# Patient Record
Sex: Female | Born: 1935 | Race: White | Hispanic: No | Smoking: Former smoker
Health system: Southern US, Community
[De-identification: ages and names within clinical notes are randomized; demographics above are authoritative.]

## PROBLEM LIST (undated history)

## (undated) DIAGNOSIS — M1990 Unspecified osteoarthritis, unspecified site: Secondary | ICD-10-CM

## (undated) DIAGNOSIS — I10 Essential (primary) hypertension: Secondary | ICD-10-CM

## (undated) DIAGNOSIS — H269 Unspecified cataract: Secondary | ICD-10-CM

## (undated) DIAGNOSIS — E079 Disorder of thyroid, unspecified: Secondary | ICD-10-CM

## (undated) DIAGNOSIS — M810 Age-related osteoporosis without current pathological fracture: Secondary | ICD-10-CM

## (undated) HISTORY — PX: EYE SURGERY: SHX253

## (undated) HISTORY — DX: Unspecified cataract: H26.9

## (undated) HISTORY — DX: Essential (primary) hypertension: I10

## (undated) HISTORY — DX: Unspecified osteoarthritis, unspecified site: M19.90

## (undated) HISTORY — DX: Disorder of thyroid, unspecified: E07.9

## (undated) HISTORY — DX: Age-related osteoporosis without current pathological fracture: M81.0

---

## 1999-01-24 ENCOUNTER — Other Ambulatory Visit: Admission: RE | Admit: 1999-01-24 | Discharge: 1999-01-24 | Payer: Self-pay | Admitting: *Deleted

## 2000-07-06 ENCOUNTER — Encounter: Admission: RE | Admit: 2000-07-06 | Discharge: 2000-07-06 | Payer: Self-pay | Admitting: Internal Medicine

## 2000-07-06 ENCOUNTER — Encounter: Payer: Self-pay | Admitting: Internal Medicine

## 2014-06-20 ENCOUNTER — Ambulatory Visit (INDEPENDENT_AMBULATORY_CARE_PROVIDER_SITE_OTHER): Payer: Medicare Other | Admitting: Family Medicine

## 2014-06-20 ENCOUNTER — Ambulatory Visit (INDEPENDENT_AMBULATORY_CARE_PROVIDER_SITE_OTHER): Payer: Medicare Other

## 2014-06-20 VITALS — BP 160/62 | HR 74 | Temp 98.3°F | Resp 16 | Ht 65.0 in | Wt 120.0 lb

## 2014-06-20 DIAGNOSIS — M25571 Pain in right ankle and joints of right foot: Secondary | ICD-10-CM

## 2014-06-20 DIAGNOSIS — Z23 Encounter for immunization: Secondary | ICD-10-CM

## 2014-06-20 DIAGNOSIS — S82892A Other fracture of left lower leg, initial encounter for closed fracture: Secondary | ICD-10-CM | POA: Diagnosis not present

## 2014-06-20 DIAGNOSIS — M79672 Pain in left foot: Secondary | ICD-10-CM | POA: Diagnosis not present

## 2014-06-20 NOTE — Progress Notes (Signed)
Chief Complaint:  Chief Complaint  Patient presents with  . Fall    Noon today    HPI: Melissa Burton is a 81 y.o. female who is here for fell off steps today, at noon, she misstepped on the last step and landed on her side. She had no prodrome sxs.  She has osteoporosis. Pain with sitting and weightbearing, worse with weightbearing. "very uncomfortable". She has no n/w/t. She has not tried anything for this.  She lives alone, in small house. She has a scratch on the top of her foot. She ahs put ice on it but nothing else. She cannot walk on her foot at all, it is "moderately to severely painful" No prior injuries.   Past Medical History  Diagnosis Date  . Arthritis   . Cataract   . Hypertension   . Osteoporosis   . Thyroid disease    Past Surgical History  Procedure Laterality Date  . Eye surgery     History   Social History  . Marital Status: Divorced    Spouse Name: N/A  . Number of Children: N/A  . Years of Education: N/A   Social History Main Topics  . Smoking status: Former Games developer  . Smokeless tobacco: Not on file  . Alcohol Use: No  . Drug Use: No  . Sexual Activity: Not on file   Other Topics Concern  . None   Social History Narrative  . None   Family History  Problem Relation Age of Onset  . Diabetes Sister   . Hyperlipidemia Sister   . Diabetes Brother   . Cancer Sister    No Known Allergies Prior to Admission medications   Medication Sig Start Date End Date Taking? Authorizing Provider  amLODipine (NORVASC) 10 MG tablet Take 10 mg by mouth daily.   Yes Historical Provider, MD  levothyroxine (SYNTHROID, LEVOTHROID) 50 MCG tablet Take 50 mcg by mouth daily before breakfast.   Yes Historical Provider, MD  lisinopril (PRINIVIL,ZESTRIL) 20 MG tablet Take 20 mg by mouth 2 (two) times daily.   Yes Historical Provider, MD     ROS: The patient denies fevers, chills, night sweats, unintentional weight loss, chest pain, palpitations, wheezing,  dyspnea on exertion, nausea, vomiting, abdominal pain, dysuria, hematuria, melena, numbness, weakness, or tingling.   All other systems have been reviewed and were otherwise negative with the exception of those mentioned in the HPI and as above.    PHYSICAL EXAM: Filed Vitals:   06/20/14 1404  BP: 160/62  Pulse: 74  Temp: 98.3 F (36.8 C)  Resp: 16   Filed Vitals:   06/20/14 1404  Height:  (1.651 m)  Weight: 120 lb (54.432 kg)   Body mass index is 19.97 kg/(m^2).   General: Alert, no acute distress HEENT:  Normocephalic, atraumatic, oropharynx patent. EOMI, PERRLA Cardiovascular:  Regular rate and rhythm, no rubs murmurs or gallops.  No Carotid bruits, radial pulse intact.  Respiratory: Clear to auscultation bilaterally.  No wheezes, rales, or rhonchi.  No cyanosis, no use of accessory musculature GI: No organomegaly, abdomen is soft and non-tender, positive bowel sounds.  No masses. Skin: No rashes. Neurologic: Facial musculature symmetric. Psychiatric: Patient is appropriate throughout our interaction. Lymphatic: No cervical lymphadenopathy Musculoskeletal: Gait antalgic Let foot -+ abrasion on top of foot, tender with dorsi and plantar flexion, 5/5 strength, sesnation intact DP intact  Right foot + swelling, no ecchymosis, + s   LABS: No results found for this  or any previous visit.   EKG/XRAY:   Primary read interpreted by Dr. Conley Rolls at [facility] for fracture or dislocation of right foot ? Left talus fracture on lateral view Please comment if there is a break in the cortex of the left navicular as well   ASSESSMENT/PLAN: Encounter Diagnoses  Name Primary?  . Pain in joint, ankle and foot, right Yes  . Left foot pain   . Avulsion fracture of ankle, left, closed, initial encounter   . Need for prophylactic vaccination with tetanus-diphtheria (TD)    81 y/o with PMH arthritis, osteoporosis who presents with left talus avulsion fracture  Refer to ortho She was  given crutches, sweedo for right foot sprain and left short cam boot  for right avulsion fracture She really needs a walker but she cannot maneuver in her house with a walker per the patient Take tylenol prn for pain She was given tetanus booster due to abrasion on foot. Not utd on tetanus until today  Gross sideeffects, risk and benefits, and alternatives of medications d/w patient. Patient is aware that all medications have potential sideeffects and we are unable to predict every sideeffect or drug-drug interaction that may occur.  Hamilton Capri PHUONG, DO 06/25/2014 7:17 PM

## 2016-03-02 IMAGING — CR DG ANKLE COMPLETE 3+V*L*
4 series · 4 of 4 positions shown · non-contrast
Comparison: Left foot radiographs obtained at the same time.

CLINICAL DATA: Left ankle pain following a fall today.

EXAM:
LEFT ANKLE COMPLETE - 3+ VIEW

[AP]
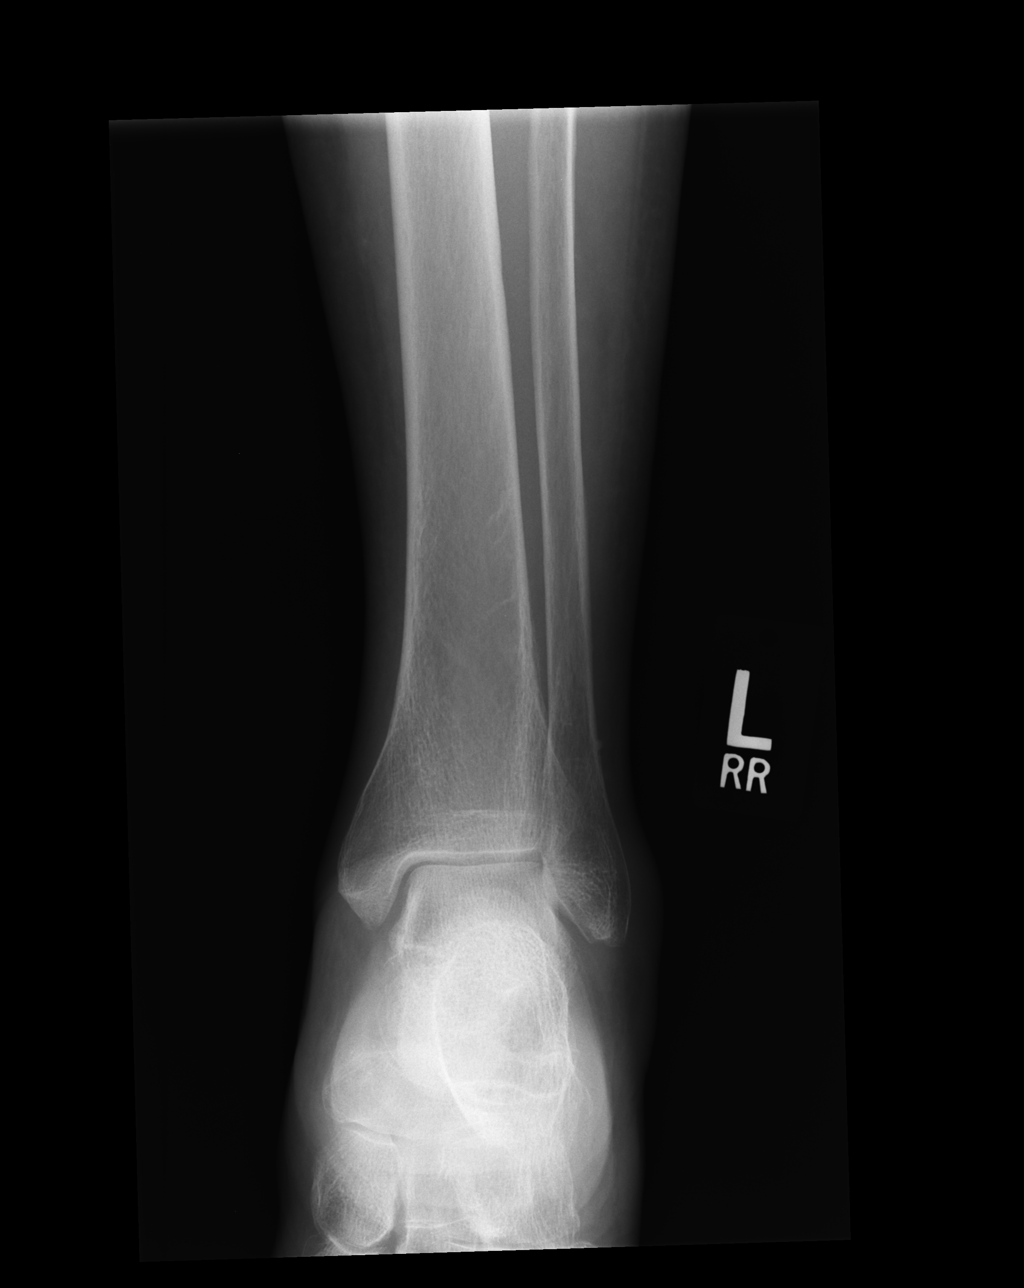

[ap obl int rot (1 of 2)]
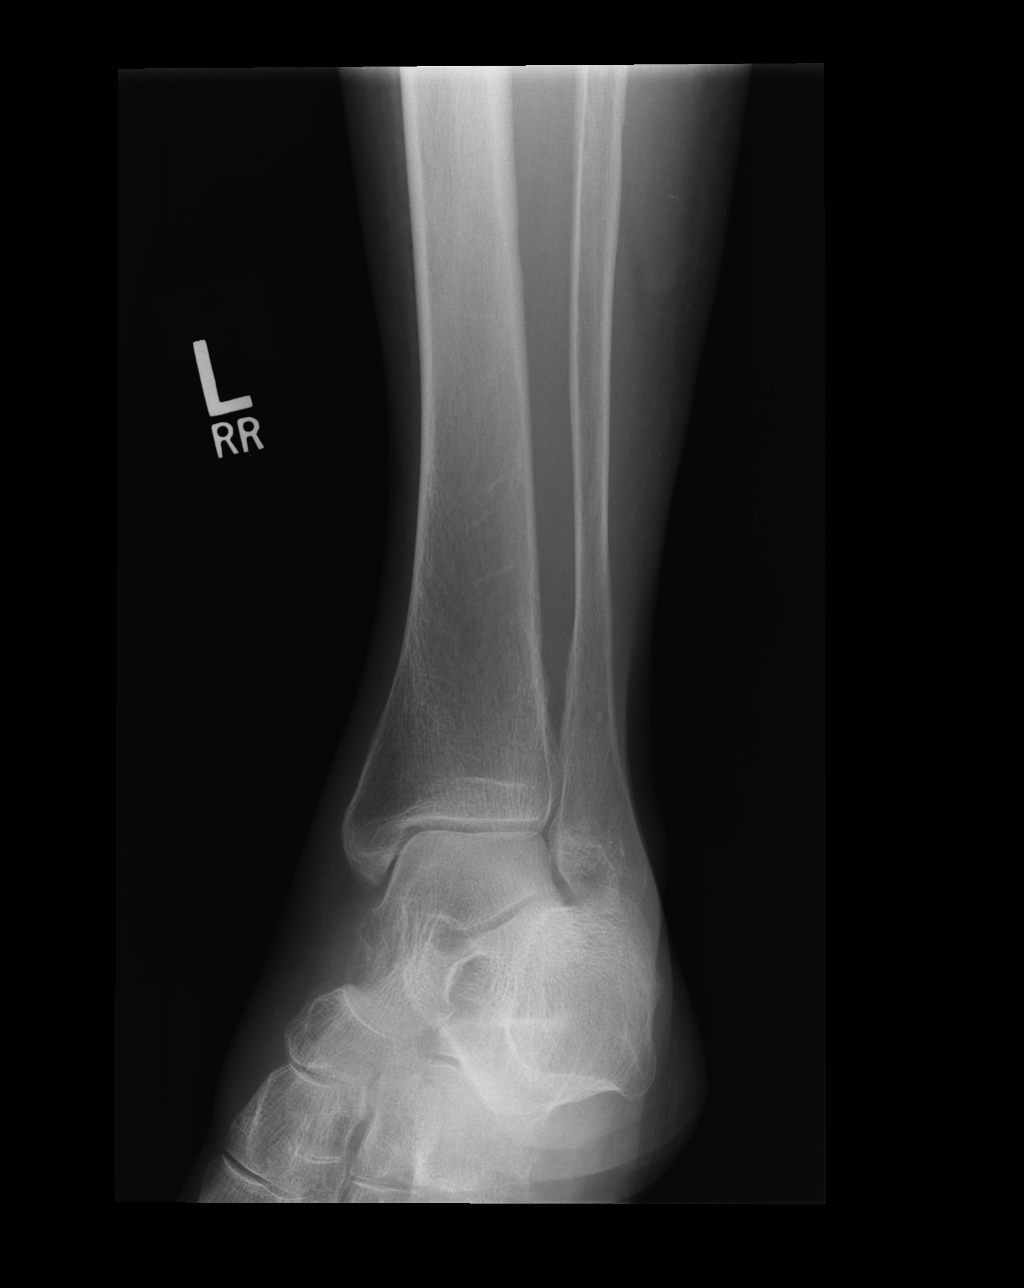

[ap obl int rot (2 of 2)]
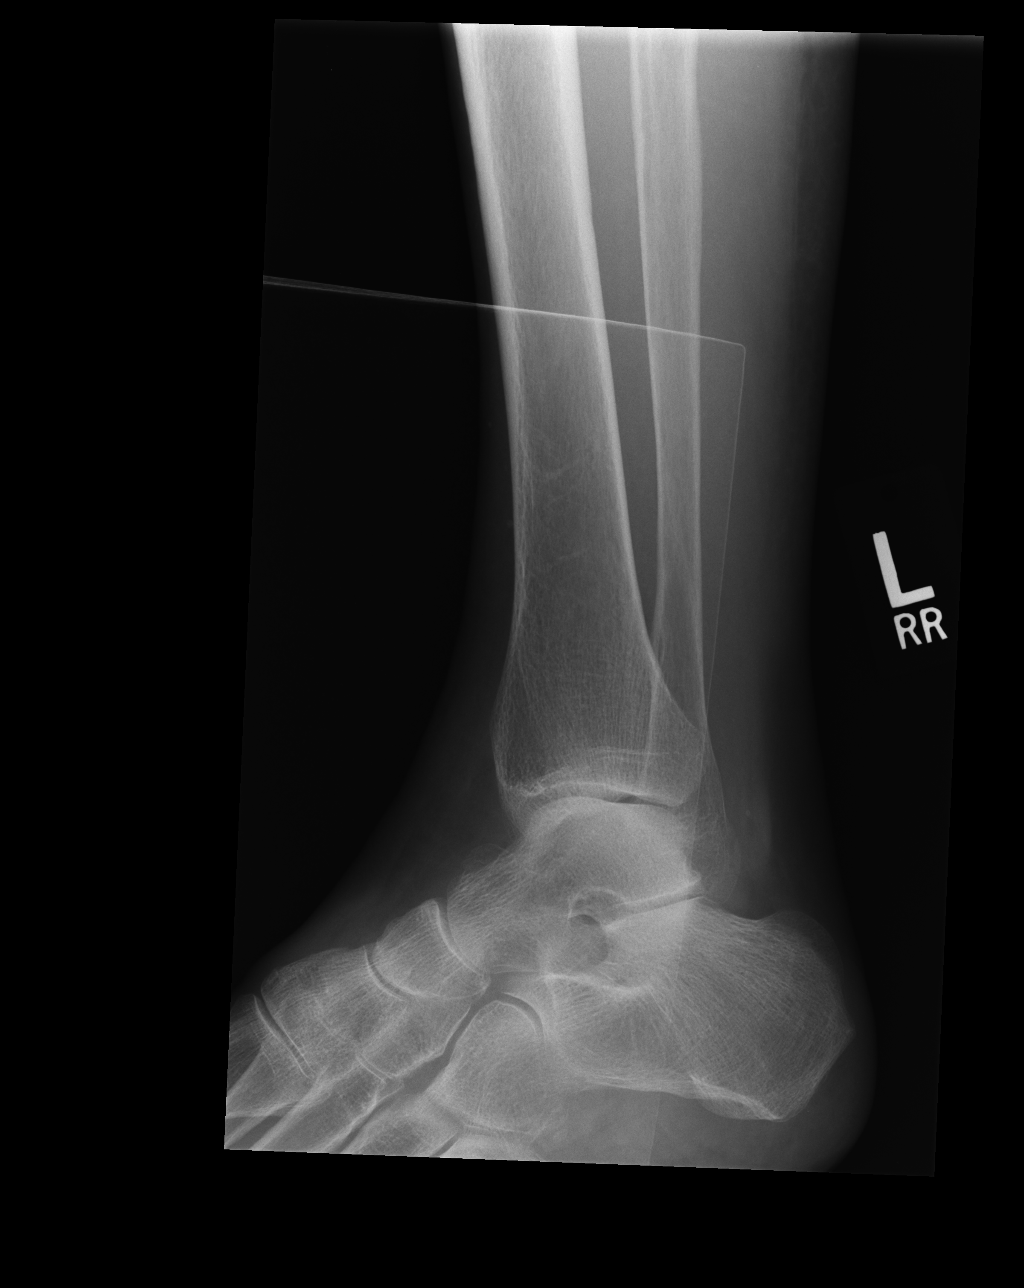

[lateral]
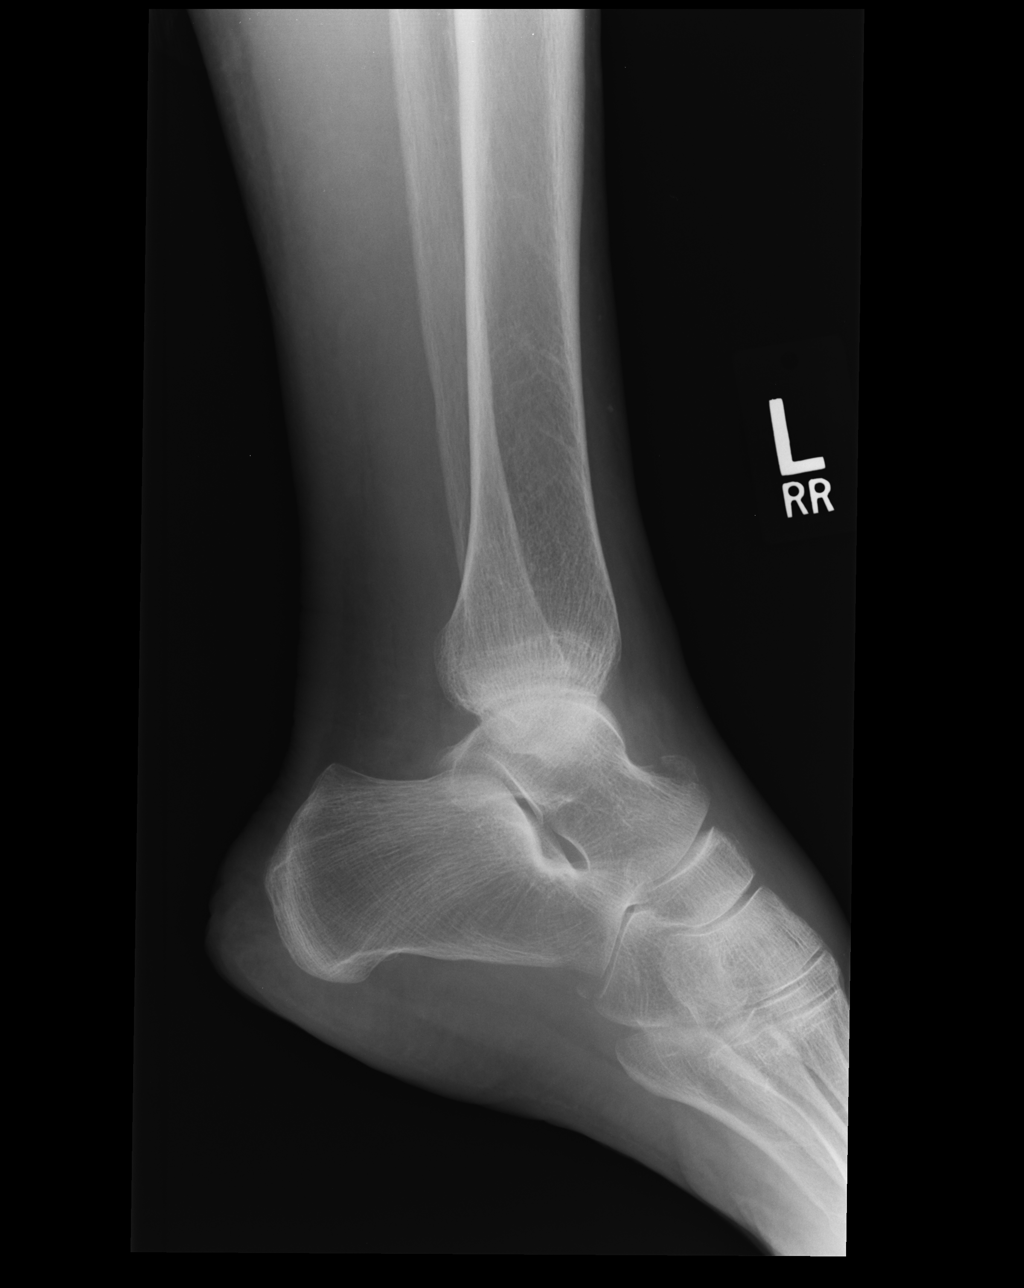

[4 of 4 positions shown; findings below may reference images not displayed]

FINDINGS: Diffuse soft tissue swelling, most pronounced laterally. Distal
dorsal talar avulsion fracture. No other fractures or dislocation.
No effusion.
IMPRESSION: Dorsal talar avulsion fracture.

## 2020-07-13 ENCOUNTER — Emergency Department (HOSPITAL_COMMUNITY)
Admission: EM | Admit: 2020-07-13 | Discharge: 2020-07-14 | Disposition: A | Discharge status: Home / Self Care | Payer: Medicare Other | Attending: Emergency Medicine | Admitting: Emergency Medicine

## 2020-07-13 ENCOUNTER — Other Ambulatory Visit: Payer: Self-pay

## 2020-07-13 ENCOUNTER — Encounter (HOSPITAL_COMMUNITY): Payer: Self-pay | Admitting: Emergency Medicine

## 2020-07-13 ENCOUNTER — Emergency Department (HOSPITAL_COMMUNITY): Payer: Medicare Other

## 2020-07-13 DIAGNOSIS — I10 Essential (primary) hypertension: Secondary | ICD-10-CM | POA: Diagnosis not present

## 2020-07-13 DIAGNOSIS — R600 Localized edema: Secondary | ICD-10-CM | POA: Insufficient documentation

## 2020-07-13 DIAGNOSIS — E871 Hypo-osmolality and hyponatremia: Secondary | ICD-10-CM | POA: Insufficient documentation

## 2020-07-13 DIAGNOSIS — R609 Edema, unspecified: Secondary | ICD-10-CM

## 2020-07-13 DIAGNOSIS — Z87891 Personal history of nicotine dependence: Secondary | ICD-10-CM | POA: Insufficient documentation

## 2020-07-13 DIAGNOSIS — Z79899 Other long term (current) drug therapy: Secondary | ICD-10-CM | POA: Diagnosis not present

## 2020-07-13 DIAGNOSIS — R2243 Localized swelling, mass and lump, lower limb, bilateral: Secondary | ICD-10-CM | POA: Diagnosis present

## 2020-07-13 LAB — COMPREHENSIVE METABOLIC PANEL
ALT: 19 U/L (ref 0–44)
AST: 23 U/L (ref 15–41)
Albumin: 3.9 g/dL (ref 3.5–5.0)
Alkaline Phosphatase: 106 U/L (ref 38–126)
Anion gap: 12 (ref 5–15)
BUN: 17 mg/dL (ref 8–23)
CO2: 22 mmol/L (ref 22–32)
Calcium: 9.2 mg/dL (ref 8.9–10.3)
Chloride: 93 mmol/L — ABNORMAL LOW (ref 98–111)
Creatinine, Ser: 0.7 mg/dL (ref 0.44–1.00)
GFR, Estimated: >60 mL/min (ref >60)
Glucose, Bld: 107 mg/dL — ABNORMAL HIGH (ref 70–99)
Potassium: 4.3 mmol/L (ref 3.5–5.1)
Sodium: 127 mmol/L — ABNORMAL LOW (ref 135–145)
Total Bilirubin: 0.7 mg/dL (ref 0.3–1.2)
Total Protein: 7.1 g/dL (ref 6.5–8.1)

## 2020-07-13 LAB — CBC WITH DIFFERENTIAL/PLATELET
Abs Immature Granulocytes: 0.02 10*3/uL (ref 0.00–0.07)
Basophils Absolute: 0.1 10*3/uL (ref 0.0–0.1)
Basophils Relative: 1 %
Eosinophils Absolute: 0.2 10*3/uL (ref 0.0–0.5)
Eosinophils Relative: 3 %
HCT: 34.8 % — ABNORMAL LOW (ref 36.0–46.0)
Hemoglobin: 12 g/dL (ref 12.0–15.0)
Immature Granulocytes: 0 %
Lymphocytes Relative: 18 %
Lymphs Abs: 1.4 10*3/uL (ref 0.7–4.0)
MCH: 31.7 pg (ref 26.0–34.0)
MCHC: 34.5 g/dL (ref 30.0–36.0)
MCV: 92.1 fL (ref 80.0–100.0)
Monocytes Absolute: 1.2 10*3/uL — ABNORMAL HIGH (ref 0.1–1.0)
Monocytes Relative: 15 %
Neutro Abs: 4.8 10*3/uL (ref 1.7–7.7)
Neutrophils Relative %: 63 %
Platelets: 367 10*3/uL (ref 150–400)
RBC: 3.78 MIL/uL — ABNORMAL LOW (ref 3.87–5.11)
RDW: 12.7 % (ref 11.5–15.5)
WBC: 7.6 10*3/uL (ref 4.0–10.5)
nRBC: 0 % (ref 0.0–0.2)

## 2020-07-13 LAB — BRAIN NATRIURETIC PEPTIDE: B Natriuretic Peptide: 95.4 pg/mL (ref 0.0–100.0)

## 2020-07-13 NOTE — ED Triage Notes (Signed)
Patient arrives after her doctor told her her sodium was low. Patient states that over the last few weeks, she has had some foot and ankle swelling also.

## 2020-07-13 NOTE — ED Provider Notes (Signed)
Emergency Medicine Provider Triage Evaluation Note  Melissa Burton , a 87 y.o. female  was evaluated in triage.  Pt complains of leg swelling.  She states her symptoms started about 3 days ago.  Reports a history of ankle swelling in the past but never of this severity.  Denies any shortness of breath or chest pain.  She followed up with her PCP yesterday who was concerned about her new onset leg swelling as well as possible developing cellulitis in the legs and started her on Lasix, Klor-Con, as well as doxycycline.  She states that she has been taking these but her symptoms have persisted.  She was contacted today and told that she was also hyponatremic and was told to come to the emergency department this evening for further evaluation.  Physical Exam  BP (!) 146/63 (BP Location: Right Arm)   Pulse 84   Temp 98.5 F (36.9 C) (Oral)   Resp 15   Ht 5\' 5"  (1.651 m)   Wt 54.9 kg   SpO2 94%   BMI 20.14 kg/m  Gen:   Awake, no distress   Resp:  Normal effort  MSK:   Moves extremities without difficulty  Other:  2+ pitting edema noted in the bilateral lower extremities up to the mid calf.  Circumferential erythema with blistering consistent with developing cellulitis.  Medical Decision Making  Medically screening exam initiated at 8:25 PM.  Appropriate orders placed.  Melissa Burton was informed that the remainder of the evaluation will be completed by another provider, this initial triage assessment does not replace that evaluation, and the importance of remaining in the ED until their evaluation is complete.   Trudee Grip, PA-C 07/13/20 2028    2029, MD 07/14/20 0040

## 2020-07-14 LAB — URINALYSIS, ROUTINE W REFLEX MICROSCOPIC
Bilirubin Urine: NEGATIVE
Glucose, UA: NEGATIVE mg/dL
Ketones, ur: 20 mg/dL — AB
Leukocytes,Ua: NEGATIVE
Nitrite: NEGATIVE
Protein, ur: NEGATIVE mg/dL
Specific Gravity, Urine: 1.01 (ref 1.005–1.030)
pH: 7 (ref 5.0–8.0)

## 2020-07-14 NOTE — ED Provider Notes (Signed)
[facility]-EMERGENCY DEPT Provider Note   CSN: [id number] Arrival date & time: 07/13/20  1928     History Chief Complaint  Patient presents with  . Leg Swelling    Melissa Burton is a 87 y.o. female.  The history is provided by the patient and a relative.  Patient presents with bilateral lower extremity edema.  Patient has had this previously, but has been getting worse for the past several days.  She has been getting monitored closely by her PCP is been placed on Lasix.  No chest pain or shortness of breath is reported.  There is also concern about cellulitis and patient was started on antibiotics. Patient was called today by PCP and told to go the ER due to low sodium. Patient is otherwise at her baseline.  She was recently diagnosed with compression fracture in her lumbar spine and has follow-up with spine specialist later in the month.  No acute issues from this tonight.  Patient is able to ambulate when she is standing.  Patient presents with her granddaughter who provides most of the history due to patient is hard of hearing    Past Medical History:  Diagnosis Date  . Arthritis   . Cataract   . Hypertension   . Osteoporosis   . Thyroid disease     There are no problems to display for this patient.   Past Surgical History:  Procedure Laterality Date  . EYE SURGERY       OB History   No obstetric history on file.     Family History  Problem Relation Age of Onset  . Diabetes Sister   . Hyperlipidemia Sister   . Diabetes Brother   . Cancer Sister     Social History   Tobacco Use  . Smoking status: Former    Pack years: 0.00  Substance Use Topics  . Alcohol use: No    Alcohol/week: 0.0 standard drinks  . Drug use: No    Home Medications Prior to Admission medications   Medication Sig Start Date End Date Taking? Authorizing Provider  amLODipine (NORVASC) 10 MG tablet Take 10 mg by mouth daily.    [provider]   levothyroxine (SYNTHROID, LEVOTHROID) 50 MCG tablet Take 50 mcg by mouth daily before breakfast.    [provider]  lisinopril (PRINIVIL,ZESTRIL) 20 MG tablet Take 20 mg by mouth 2 (two) times daily.    [provider]    Allergies    Patient has no known allergies.  Review of Systems   Review of Systems  Constitutional:  Negative for fever.  Respiratory:  Negative for shortness of breath.   Cardiovascular:  Positive for leg swelling. Negative for chest pain.  Musculoskeletal:  Positive for back pain.  All other systems reviewed and are negative.  Physical Exam Updated Vital Signs BP (!) 160/65   Pulse (!) 101   Temp 98.5 F (36.9 C) (Oral)   Resp 20   Ht 1.651 m (5\' 5" )   Wt 54.9 kg   SpO2 94%   BMI 20.14 kg/m   Physical Exam CONSTITUTIONAL: Elderly, no acute distress HEAD: Normocephalic/atraumatic EYES: EOMI/PERRL ENMT: Mucous membranes moist NECK: supple no meningeal signs SPINE/BACK:entire spine nontender CV: S1/S2 noted, no murmurs/rubs/gallops noted LUNGS: Lungs are clear to auscultation bilaterally, no apparent distress ABDOMEN: soft, nontender GU:no cva tenderness NEURO: Pt is awake/alert/appropriate, moves all extremitiesx4.  No facial droop.   EXTREMITIES: pulses normal/equal, full ROM, symmetric pitting edema to bilateral  lower extremities.  Overlying erythema is noted in both legs.  No crepitus is noted. SKIN: warm, color normal PSYCH: no abnormalities of mood noted, alert and oriented to situation  ED Results / Procedures / Treatments   Labs (all labs ordered are listed, but only abnormal results are displayed) Labs Reviewed  CBC WITH DIFFERENTIAL/PLATELET - Abnormal; Notable for the following components:      Result Value   RBC 3.78 (*)    HCT 34.8 (*)    Monocytes Absolute 1.2 (*)    All other components within normal limits  COMPREHENSIVE METABOLIC PANEL - Abnormal; Notable for the following components:   Sodium 127 (*)     Chloride 93 (*)    Glucose, Bld 107 (*)    All other components within normal limits  URINALYSIS, ROUTINE W REFLEX MICROSCOPIC - Abnormal; Notable for the following components:   Color, Urine STRAW (*)    Hgb urine dipstick SMALL (*)    Ketones, ur 20 (*)    Bacteria, UA RARE (*)    Crystals PRESENT (*)    All other components within normal limits  BRAIN NATRIURETIC PEPTIDE    EKG EKG Interpretation  Date/Time:  Tuesday July 13 2020 20:54:48 EDT Ventricular Rate:  80 PR Interval:  206 QRS Duration: 78 QT Interval:  366 QTC Calculation: 422 R Axis:   31 Text Interpretation: Normal sinus rhythm Possible Anterior infarct , age undetermined Abnormal ECG Confirmed by Zadie Rhine ([id number]) on 07/13/2020 11:08:49 PM  Radiology No results found.  Procedures Procedures   Medications Ordered in ED Medications - No data to display  ED Course  I have reviewed the triage vital signs and the nursing notes.  Pertinent labs  results that were available during my care of the patient were reviewed by me and considered in my medical decision making (see chart for details).    MDM Rules/Calculators/A&P                          Patient was told to go to the ER due to hyponatremia and concern since patient is on furosemide.  When comparing previous labs, her sodium is actually improving.  No signs of CHF.  She already had a negative x-ray by her PCP.  She already had negative DVT studies.  Patient is on appropriate therapy as an outpatient, is already on antibiotics for possible cellulitis. She is  in no acute distress.  Patient can ambulate when she is standing, and she already has follow-up for her compression fracture She reports her pain is well controlled with Tylenol I have advised her to hold the Lasix for the next several days until she gets repeat electrolytes. Of note, her DVT studies did reveal a Baker's cyst which could be contribute to the edema and patient sits to most of the  day so some of this could be dependent edema Final Clinical Impression(s) / ED Diagnoses Final diagnoses:  Peripheral edema  Hyponatremia    Rx / DC Orders ED Discharge Orders     None        Zadie Rhine, MD 07/14/20 [phone]

## 2023-01-11 ENCOUNTER — Inpatient Hospital Stay (HOSPITAL_COMMUNITY): Payer: Medicare Other

## 2023-01-11 ENCOUNTER — Emergency Department (HOSPITAL_COMMUNITY): Payer: Medicare Other | Admitting: Anesthesiology

## 2023-01-11 ENCOUNTER — Inpatient Hospital Stay (HOSPITAL_COMMUNITY)
Admission: EM | Admit: 2023-01-11 | Discharge: 2023-02-03 | DRG: 023 | Disposition: E | Payer: Medicare Other | Attending: Student in an Organized Health Care Education/Training Program | Admitting: Student in an Organized Health Care Education/Training Program

## 2023-01-11 ENCOUNTER — Encounter (HOSPITAL_COMMUNITY): Admission: EM | Disposition: E | Payer: Self-pay | Source: Home / Self Care | Attending: Neurology

## 2023-01-11 ENCOUNTER — Emergency Department (HOSPITAL_COMMUNITY): Payer: Medicare Other

## 2023-01-11 DIAGNOSIS — H538 Other visual disturbances: Secondary | ICD-10-CM | POA: Diagnosis present

## 2023-01-11 DIAGNOSIS — R1310 Dysphagia, unspecified: Secondary | ICD-10-CM | POA: Diagnosis present

## 2023-01-11 DIAGNOSIS — E43 Unspecified severe protein-calorie malnutrition: Secondary | ICD-10-CM | POA: Diagnosis present

## 2023-01-11 DIAGNOSIS — R29727 NIHSS score 27: Secondary | ICD-10-CM | POA: Diagnosis present

## 2023-01-11 DIAGNOSIS — E039 Hypothyroidism, unspecified: Secondary | ICD-10-CM | POA: Diagnosis present

## 2023-01-11 DIAGNOSIS — Z7189 Other specified counseling: Secondary | ICD-10-CM

## 2023-01-11 DIAGNOSIS — L89150 Pressure ulcer of sacral region, unstageable: Secondary | ICD-10-CM | POA: Diagnosis present

## 2023-01-11 DIAGNOSIS — S0003XA Contusion of scalp, initial encounter: Secondary | ICD-10-CM | POA: Diagnosis present

## 2023-01-11 DIAGNOSIS — H9190 Unspecified hearing loss, unspecified ear: Secondary | ICD-10-CM | POA: Diagnosis present

## 2023-01-11 DIAGNOSIS — Z1152 Encounter for screening for COVID-19: Secondary | ICD-10-CM

## 2023-01-11 DIAGNOSIS — Z79890 Hormone replacement therapy (postmenopausal): Secondary | ICD-10-CM

## 2023-01-11 DIAGNOSIS — Z515 Encounter for palliative care: Secondary | ICD-10-CM | POA: Diagnosis not present

## 2023-01-11 DIAGNOSIS — I10 Essential (primary) hypertension: Secondary | ICD-10-CM

## 2023-01-11 DIAGNOSIS — G936 Cerebral edema: Secondary | ICD-10-CM | POA: Diagnosis present

## 2023-01-11 DIAGNOSIS — I63511 Cerebral infarction due to unspecified occlusion or stenosis of right middle cerebral artery: Principal | ICD-10-CM | POA: Diagnosis present

## 2023-01-11 DIAGNOSIS — I110 Hypertensive heart disease with heart failure: Secondary | ICD-10-CM | POA: Diagnosis present

## 2023-01-11 DIAGNOSIS — I619 Nontraumatic intracerebral hemorrhage, unspecified: Secondary | ICD-10-CM | POA: Diagnosis present

## 2023-01-11 DIAGNOSIS — I5032 Chronic diastolic (congestive) heart failure: Secondary | ICD-10-CM | POA: Diagnosis present

## 2023-01-11 DIAGNOSIS — Z833 Family history of diabetes mellitus: Secondary | ICD-10-CM

## 2023-01-11 DIAGNOSIS — R0689 Other abnormalities of breathing: Secondary | ICD-10-CM | POA: Diagnosis not present

## 2023-01-11 DIAGNOSIS — Z6821 Body mass index (BMI) 21.0-21.9, adult: Secondary | ICD-10-CM

## 2023-01-11 DIAGNOSIS — R7303 Prediabetes: Secondary | ICD-10-CM | POA: Diagnosis present

## 2023-01-11 DIAGNOSIS — X58XXXA Exposure to other specified factors, initial encounter: Secondary | ICD-10-CM | POA: Diagnosis present

## 2023-01-11 DIAGNOSIS — Z87891 Personal history of nicotine dependence: Secondary | ICD-10-CM | POA: Diagnosis not present

## 2023-01-11 DIAGNOSIS — H5347 Heteronymous bilateral field defects: Secondary | ICD-10-CM | POA: Diagnosis present

## 2023-01-11 DIAGNOSIS — I959 Hypotension, unspecified: Secondary | ICD-10-CM | POA: Diagnosis present

## 2023-01-11 DIAGNOSIS — Z66 Do not resuscitate: Secondary | ICD-10-CM | POA: Diagnosis present

## 2023-01-11 DIAGNOSIS — I6601 Occlusion and stenosis of right middle cerebral artery: Secondary | ICD-10-CM | POA: Diagnosis present

## 2023-01-11 DIAGNOSIS — E782 Mixed hyperlipidemia: Secondary | ICD-10-CM | POA: Diagnosis present

## 2023-01-11 DIAGNOSIS — I081 Rheumatic disorders of both mitral and tricuspid valves: Secondary | ICD-10-CM | POA: Diagnosis present

## 2023-01-11 DIAGNOSIS — I639 Cerebral infarction, unspecified: Secondary | ICD-10-CM | POA: Diagnosis not present

## 2023-01-11 DIAGNOSIS — G8194 Hemiplegia, unspecified affecting left nondominant side: Secondary | ICD-10-CM | POA: Diagnosis present

## 2023-01-11 DIAGNOSIS — Z83438 Family history of other disorder of lipoprotein metabolism and other lipidemia: Secondary | ICD-10-CM

## 2023-01-11 DIAGNOSIS — Z79899 Other long term (current) drug therapy: Secondary | ICD-10-CM

## 2023-01-11 DIAGNOSIS — M810 Age-related osteoporosis without current pathological fracture: Secondary | ICD-10-CM | POA: Diagnosis present

## 2023-01-11 DIAGNOSIS — I480 Paroxysmal atrial fibrillation: Secondary | ICD-10-CM | POA: Diagnosis present

## 2023-01-11 DIAGNOSIS — Y929 Unspecified place or not applicable: Secondary | ICD-10-CM | POA: Diagnosis not present

## 2023-01-11 DIAGNOSIS — R54 Age-related physical debility: Secondary | ICD-10-CM | POA: Diagnosis present

## 2023-01-11 DIAGNOSIS — R4182 Altered mental status, unspecified: Secondary | ICD-10-CM | POA: Diagnosis present

## 2023-01-11 DIAGNOSIS — I5030 Unspecified diastolic (congestive) heart failure: Secondary | ICD-10-CM

## 2023-01-11 DIAGNOSIS — R29810 Facial weakness: Secondary | ICD-10-CM | POA: Diagnosis present

## 2023-01-11 HISTORY — PX: IR CT HEAD LTD: IMG2386

## 2023-01-11 HISTORY — PX: IR PERCUTANEOUS ART THROMBECTOMY/INFUSION INTRACRANIAL INC DIAG ANGIO: IMG6087

## 2023-01-11 HISTORY — PX: RADIOLOGY WITH ANESTHESIA: SHX6223

## 2023-01-11 LAB — DIFFERENTIAL
Abs Immature Granulocytes: 0.07 10*3/uL (ref 0.00–0.07)
Basophils Absolute: 0 10*3/uL (ref 0.0–0.1)
Basophils Relative: 0 %
Eosinophils Absolute: 0 10*3/uL (ref 0.0–0.5)
Eosinophils Relative: 0 %
Immature Granulocytes: 1 %
Lymphocytes Relative: 5 %
Lymphs Abs: 0.7 10*3/uL (ref 0.7–4.0)
Monocytes Absolute: 0.9 10*3/uL (ref 0.1–1.0)
Monocytes Relative: 6 %
Neutro Abs: 12.6 10*3/uL — ABNORMAL HIGH (ref 1.7–7.7)
Neutrophils Relative %: 88 %

## 2023-01-11 LAB — POCT I-STAT 7, (LYTES, BLD GAS, ICA,H+H)
Acid-base deficit: 5 mmol/L — ABNORMAL HIGH (ref 0.0–2.0)
Bicarbonate: 18.8 mmol/L — ABNORMAL LOW (ref 20.0–28.0)
Calcium, Ion: 1.07 mmol/L — ABNORMAL LOW (ref 1.15–1.40)
HCT: 33 % — ABNORMAL LOW (ref 36.0–46.0)
Hemoglobin: 11.2 g/dL — ABNORMAL LOW (ref 12.0–15.0)
O2 Saturation: 100 %
Patient temperature: 99.3
Potassium: 2.6 mmol/L — CL (ref 3.5–5.1)
Sodium: 138 mmol/L (ref 135–145)
TCO2: 20 mmol/L — ABNORMAL LOW (ref 22–32)
pCO2 arterial: 32.4 mm[Hg] (ref 32–48)
pH, Arterial: 7.374 (ref 7.35–7.45)
pO2, Arterial: 285 mm[Hg] — ABNORMAL HIGH (ref 83–108)

## 2023-01-11 LAB — COMPREHENSIVE METABOLIC PANEL
ALT: 28 U/L (ref 0–44)
AST: 57 U/L — ABNORMAL HIGH (ref 15–41)
Albumin: 3.9 g/dL (ref 3.5–5.0)
Alkaline Phosphatase: 120 U/L (ref 38–126)
Anion gap: 14 (ref 5–15)
BUN: 16 mg/dL (ref 8–23)
CO2: 19 mmol/L — ABNORMAL LOW (ref 22–32)
Calcium: 9.2 mg/dL (ref 8.9–10.3)
Chloride: 100 mmol/L (ref 98–111)
Creatinine, Ser: 0.69 mg/dL (ref 0.44–1.00)
GFR, Estimated: >60 mL/min (ref >60)
Glucose, Bld: 138 mg/dL — ABNORMAL HIGH (ref 70–99)
Potassium: 4.2 mmol/L (ref 3.5–5.1)
Sodium: 133 mmol/L — ABNORMAL LOW (ref 135–145)
Total Bilirubin: 1.1 mg/dL (ref 0.0–1.2)
Total Protein: 7 g/dL (ref 6.5–8.1)

## 2023-01-11 LAB — I-STAT CHEM 8, ED
BUN: 18 mg/dL (ref 8–23)
Calcium, Ion: 1.15 mmol/L (ref 1.15–1.40)
Chloride: 100 mmol/L (ref 98–111)
Creatinine, Ser: 0.5 mg/dL (ref 0.44–1.00)
Glucose, Bld: 144 mg/dL — ABNORMAL HIGH (ref 70–99)
HCT: 43 % (ref 36.0–46.0)
Hemoglobin: 14.6 g/dL (ref 12.0–15.0)
Potassium: 4.3 mmol/L (ref 3.5–5.1)
Sodium: 135 mmol/L (ref 135–145)
TCO2: 23 mmol/L (ref 22–32)

## 2023-01-11 LAB — CBC
HCT: 42.3 % (ref 36.0–46.0)
Hemoglobin: 14.2 g/dL (ref 12.0–15.0)
MCH: 31.3 pg (ref 26.0–34.0)
MCHC: 33.6 g/dL (ref 30.0–36.0)
MCV: 93.2 fL (ref 80.0–100.0)
Platelets: 279 10*3/uL (ref 150–400)
RBC: 4.54 MIL/uL (ref 3.87–5.11)
RDW: 12.4 % (ref 11.5–15.5)
WBC: 14.4 10*3/uL — ABNORMAL HIGH (ref 4.0–10.5)
nRBC: 0 % (ref 0.0–0.2)

## 2023-01-11 LAB — SARS CORONAVIRUS 2 BY RT PCR: SARS Coronavirus 2 by RT PCR: NEGATIVE

## 2023-01-11 LAB — GLUCOSE, CAPILLARY: Glucose-Capillary: 128 mg/dL — ABNORMAL HIGH (ref 70–99)

## 2023-01-11 LAB — CBG MONITORING, ED: Glucose-Capillary: 140 mg/dL — ABNORMAL HIGH (ref 70–99)

## 2023-01-11 LAB — PROTIME-INR
INR: 1.1 (ref 0.8–1.2)
Prothrombin Time: 13.9 s (ref 11.4–15.2)

## 2023-01-11 LAB — HEMOGLOBIN A1C
Hgb A1c MFr Bld: 6.1 % — ABNORMAL HIGH (ref 4.8–5.6)
Mean Plasma Glucose: 128.37 mg/dL

## 2023-01-11 LAB — ETHANOL: Alcohol, Ethyl (B): <10 mg/dL (ref <10)

## 2023-01-11 LAB — APTT: aPTT: 25 s (ref 24–36)

## 2023-01-11 SURGERY — IR WITH ANESTHESIA
Anesthesia: General

## 2023-01-11 MED ORDER — FENTANYL CITRATE (PF) 250 MCG/5ML IJ SOLN
INTRAMUSCULAR | Status: DC | PRN
  Administered 2023-01-11 (×2): 25 ug via INTRAVENOUS
  Administered 2023-01-11: 50 ug via INTRAVENOUS

## 2023-01-11 MED ORDER — ACETAMINOPHEN 160 MG/5ML PO SOLN: 650.0000 mg | ORAL | Status: DC | PRN

## 2023-01-11 MED ORDER — SUCCINYLCHOLINE CHLORIDE 200 MG/10ML IV SOSY
PREFILLED_SYRINGE | INTRAVENOUS | Status: DC | PRN
  Administered 2023-01-11: 80 mg via INTRAVENOUS

## 2023-01-11 MED ORDER — CEFAZOLIN SODIUM-DEXTROSE 2-4 GM/100ML-% IV SOLN
INTRAVENOUS | Status: AC
  Filled 2023-01-11: qty 100

## 2023-01-11 MED ORDER — CLEVIDIPINE BUTYRATE 0.5 MG/ML IV EMUL
INTRAVENOUS | Status: DC | PRN
  Administered 2023-01-11: 1 mg/h via INTRAVENOUS

## 2023-01-11 MED ORDER — CLEVIDIPINE BUTYRATE 0.5 MG/ML IV EMUL
INTRAVENOUS | Status: AC
  Filled 2023-01-11: qty 50

## 2023-01-11 MED ORDER — CHLORHEXIDINE GLUCONATE CLOTH 2 % EX PADS
6.0000 | MEDICATED_PAD | Freq: Every day | CUTANEOUS | Status: DC
  Administered 2023-01-11 – 2023-01-12 (×2): 6 via TOPICAL

## 2023-01-11 MED ORDER — SODIUM CHLORIDE 0.9 % IV SOLN: INTRAVENOUS | Status: DC

## 2023-01-11 MED ORDER — CANGRELOR TETRASODIUM 50 MG IV SOLR
INTRAVENOUS | Status: AC
  Filled 2023-01-11: qty 50

## 2023-01-11 MED ORDER — ACETAMINOPHEN 325 MG PO TABS: 650.0000 mg | ORAL_TABLET | ORAL | Status: DC | PRN

## 2023-01-11 MED ORDER — STROKE: EARLY STAGES OF RECOVERY BOOK
Freq: Once | Status: AC
  Filled 2023-01-11: qty 1

## 2023-01-11 MED ORDER — NITROGLYCERIN 1 MG/10 ML FOR IR/CATH LAB
INTRA_ARTERIAL | Status: AC
  Filled 2023-01-11: qty 10

## 2023-01-11 MED ORDER — DOCUSATE SODIUM 50 MG/5ML PO LIQD
100.0000 mg | Freq: Two times a day (BID) | ORAL | Status: DC
  Administered 2023-01-11 – 2023-01-13 (×4): 100 mg
  Filled 2023-01-11 (×4): qty 10

## 2023-01-11 MED ORDER — PROPOFOL 10 MG/ML IV BOLUS
INTRAVENOUS | Status: DC | PRN
  Administered 2023-01-11: 100 mg via INTRAVENOUS

## 2023-01-11 MED ORDER — IOHEXOL 300 MG/ML  SOLN
150.0000 mL | Freq: Once | INTRAMUSCULAR | Status: AC | PRN
  Administered 2023-01-11: 100 mL via INTRA_ARTERIAL

## 2023-01-11 MED ORDER — ROCURONIUM BROMIDE 10 MG/ML (PF) SYRINGE
PREFILLED_SYRINGE | INTRAVENOUS | Status: DC | PRN
  Administered 2023-01-11 (×2): 50 mg via INTRAVENOUS

## 2023-01-11 MED ORDER — PROPOFOL 500 MG/50ML IV EMUL
INTRAVENOUS | Status: DC | PRN
  Administered 2023-01-11: 30 ug/kg/min via INTRAVENOUS

## 2023-01-11 MED ORDER — FAMOTIDINE 20 MG PO TABS
20.0000 mg | ORAL_TABLET | Freq: Two times a day (BID) | ORAL | Status: DC
  Administered 2023-01-11: 20 mg
  Filled 2023-01-11: qty 1

## 2023-01-11 MED ORDER — SENNOSIDES-DOCUSATE SODIUM 8.6-50 MG PO TABS: 1.0000 | ORAL_TABLET | Freq: Every evening | ORAL | Status: DC | PRN

## 2023-01-11 MED ORDER — FENTANYL CITRATE (PF) 100 MCG/2ML IJ SOLN
INTRAMUSCULAR | Status: AC
  Filled 2023-01-11: qty 2

## 2023-01-11 MED ORDER — LIDOCAINE 2% (20 MG/ML) 5 ML SYRINGE
INTRAMUSCULAR | Status: DC | PRN
  Administered 2023-01-11: 60 mg via INTRAVENOUS

## 2023-01-11 MED ORDER — ACETAMINOPHEN 650 MG RE SUPP: 650.0000 mg | RECTAL | Status: DC | PRN

## 2023-01-11 MED ORDER — LACTATED RINGERS IV SOLN: INTRAVENOUS | Status: DC | PRN

## 2023-01-11 MED ORDER — SODIUM CHLORIDE 0.9 % IV SOLN: INTRAVENOUS | Status: DC | PRN

## 2023-01-11 MED ORDER — PHENYLEPHRINE HCL-NACL 20-0.9 MG/250ML-% IV SOLN
INTRAVENOUS | Status: DC | PRN
  Administered 2023-01-11: 20 ug/min via INTRAVENOUS

## 2023-01-11 MED ORDER — ORAL CARE MOUTH RINSE
15.0000 mL | OROMUCOSAL | Status: DC
  Administered 2023-01-11 – 2023-01-13 (×22): 15 mL via OROMUCOSAL

## 2023-01-11 MED ORDER — EPTIFIBATIDE 20 MG/10ML IV SOLN
INTRAVENOUS | Status: AC
  Filled 2023-01-11: qty 10

## 2023-01-11 MED ORDER — VERAPAMIL HCL 2.5 MG/ML IV SOLN
INTRAVENOUS | Status: AC
  Filled 2023-01-11: qty 2

## 2023-01-11 MED ORDER — POLYETHYLENE GLYCOL 3350 17 G PO PACK
17.0000 g | PACK | Freq: Every day | ORAL | Status: DC
Start: 2023-01-11 — End: 2023-01-13
  Administered 2023-01-12 – 2023-01-13 (×2): 17 g
  Filled 2023-01-11 (×2): qty 1

## 2023-01-11 MED ORDER — PROPOFOL 1000 MG/100ML IV EMUL
0.0000 ug/kg/min | INTRAVENOUS | Status: DC
  Administered 2023-01-11: 20 ug/kg/min via INTRAVENOUS
  Administered 2023-01-11: 30 ug/kg/min via INTRAVENOUS
  Administered 2023-01-12: 15 ug/kg/min via INTRAVENOUS
  Filled 2023-01-11 (×2): qty 100

## 2023-01-11 MED ORDER — SODIUM CHLORIDE 0.9 % IV SOLN: INTRAVENOUS | Status: AC

## 2023-01-11 MED ORDER — CLEVIDIPINE BUTYRATE 0.5 MG/ML IV EMUL
0.0000 mg/h | INTRAVENOUS | Status: DC
  Administered 2023-01-11: 4 mg/h via INTRAVENOUS
  Administered 2023-01-12: 2 mg/h via INTRAVENOUS
  Filled 2023-01-11 (×2): qty 50

## 2023-01-11 MED ORDER — PHENYLEPHRINE 80 MCG/ML (10ML) SYRINGE FOR IV PUSH (FOR BLOOD PRESSURE SUPPORT)
PREFILLED_SYRINGE | INTRAVENOUS | Status: DC | PRN
  Administered 2023-01-11 (×5): 80 ug via INTRAVENOUS
  Administered 2023-01-11: 160 ug via INTRAVENOUS
  Administered 2023-01-11 (×2): 80 ug via INTRAVENOUS
  Administered 2023-01-11: 160 ug via INTRAVENOUS
  Administered 2023-01-11: 80 ug via INTRAVENOUS
  Administered 2023-01-11: 160 ug via INTRAVENOUS
  Administered 2023-01-11: 80 ug via INTRAVENOUS

## 2023-01-11 MED ORDER — ALBUMIN HUMAN 5 % IV SOLN: INTRAVENOUS | Status: DC | PRN

## 2023-01-11 MED ORDER — SODIUM CHLORIDE (PF) 0.9 % IJ SOLN
INTRAVENOUS | Status: AC | PRN
  Administered 2023-01-11 (×6): 25 ug via INTRA_ARTERIAL

## 2023-01-11 MED ORDER — ORAL CARE MOUTH RINSE: 15.0000 mL | OROMUCOSAL | Status: DC | PRN

## 2023-01-11 MED ORDER — IOHEXOL 350 MG/ML SOLN
100.0000 mL | Freq: Once | INTRAVENOUS | Status: AC | PRN
  Administered 2023-01-11: 100 mL via INTRAVENOUS

## 2023-01-11 NOTE — Progress Notes (Signed)
 Responded to Code Stroke. Pt preparing for CT. Chaplain will follow as needed.  Venida Jarvis, [place], [facility], Pager [phone]

## 2023-01-11 NOTE — ED Provider Notes (Signed)
 [facility] AT [facility] Provider Note   CSN: [id number] Arrival date & time: 01/11/23  1242  An emergency department physician performed an initial assessment on this suspected stroke patient at 1245.  History  No chief complaint on file.   Melissa Burton is a 89 y.o. female.  89 yo F with a cc of acute ams.  Right sided gaze.  Arrived as code stroke.  Level 5 caveat acuity of condition.        Home Medications Prior to Admission medications   Medication Sig Start Date End Date Taking? Authorizing Provider  atorvastatin  (LIPITOR) 10 MG tablet Take 1 tablet by mouth daily. 11/02/22  Yes [provider]  metoprolol  succinate (TOPROL -XL) 25 MG 24 hr tablet Take 1 tablet by mouth daily. 11/02/22  Yes [provider]  Vitamin D, Ergocalciferol, (DRISDOL) 1.25 MG (50000 UNIT) CAPS capsule Take by mouth. 11/02/22 02/02/23 Yes [provider]  amLODipine (NORVASC) 10 MG tablet Take 10 mg by mouth daily.    [provider]  levothyroxine  (SYNTHROID , LEVOTHROID) 50 MCG tablet Take 50 mcg by mouth daily before breakfast.    [provider]  lisinopril (PRINIVIL,ZESTRIL) 20 MG tablet Take 20 mg by mouth 2 (two) times daily.    [provider]      Allergies    Patient has no known allergies.    Review of Systems   Review of Systems  Physical Exam Updated Vital Signs Wt 57.8 kg   BMI 21.20 kg/m  Physical Exam Vitals and nursing note reviewed.  Constitutional:      General: She is not in acute distress.    Appearance: She is well-developed. She is not diaphoretic.  HENT:     Head: Normocephalic and atraumatic.  Eyes:     Pupils: Pupils are equal, round, and reactive to light.  Cardiovascular:     Rate and Rhythm: Normal rate and regular rhythm.     Heart sounds: No murmur heard.    No friction rub. No gallop.  Pulmonary:     Effort: Pulmonary effort is normal.     Breath sounds: No  wheezing or rales.  Abdominal:     General: There is no distension.     Palpations: Abdomen is soft.     Tenderness: There is no abdominal tenderness.  Musculoskeletal:        General: No tenderness.     Cervical back: Normal range of motion and neck supple.  Skin:    General: Skin is warm and dry.  Neurological:     Mental Status: She is alert.     Comments: Right-sided gaze preference.  Garbled speech.  Psychiatric:        Behavior: Behavior normal.     ED Results / Procedures / Treatments   Labs (all labs ordered are listed, but only abnormal results are displayed) Labs Reviewed  CBC - Abnormal; Notable for the following components:      Result Value   WBC 14.4 (*)    All other components within normal limits  DIFFERENTIAL - Abnormal; Notable for the following components:   Neutro Abs 12.6 (*)    All other components within normal limits  COMPREHENSIVE METABOLIC PANEL - Abnormal; Notable for the following components:   Sodium 133 (*)    CO2 19 (*)    Glucose, Bld 138 (*)    AST 57 (*)    All other components within normal limits  I-STAT CHEM  8, ED - Abnormal; Notable for the following components:   Glucose, Bld 144 (*)    All other components within normal limits  CBG MONITORING, ED - Abnormal; Notable for the following components:   Glucose-Capillary 140 (*)    All other components within normal limits  SARS CORONAVIRUS 2 BY RT PCR  ETHANOL  PROTIME-INR  APTT  RAPID URINE DRUG SCREEN, HOSP PERFORMED  URINALYSIS, ROUTINE W REFLEX MICROSCOPIC  HEMOGLOBIN A1C    EKG None  Radiology CT ANGIO HEAD NECK W WO CM W PERF (CODE STROKE) Result Date: 01/11/2023 CLINICAL DATA:  Neuro deficit, acute, stroke suspected. Right-sided gaze. Left-sided droop and weakness. Abnormal CT. EXAM: CT ANGIOGRAPHY HEAD AND NECK CT PERFUSION BRAIN TECHNIQUE: Multidetector CT imaging of the head and neck was performed using the standard protocol during bolus administration of intravenous  contrast. Multiplanar CT image reconstructions and MIPs were obtained to evaluate the vascular anatomy. Carotid stenosis measurements (when applicable) are obtained utilizing NASCET criteria, using the distal internal carotid diameter as the denominator. Multiphase CT imaging of the brain was performed following IV bolus contrast injection. Subsequent parametric perfusion maps were calculated using RAPID software. RADIATION DOSE REDUCTION: This exam was performed according to the departmental dose-optimization program which includes automated exposure control, adjustment of the mA and/or kV according to patient size and/or use of iterative reconstruction technique. CONTRAST:  OMNIPAQUE  IOHEXOL  350 MG/ML SOLN COMPARISON:  CT head without contrast 01/11/2023 FINDINGS: Aortic arch: Atherosclerotic calcifications are present at the aortic arch. Calcifications are present the origin left common carotid artery and the subclavian artery without focal stenosis. Left vertebral artery originates directly from the arch. No aneurysm or stenosis is present. No dissection is present. Right carotid system: The right common carotid artery is within normal limits. Atherosclerotic changes are present bifurcation with calcified and noncalcified plaque. No focal stenosis is present. The cervical right ICA is within normal limits. Left carotid system: Minimal mural calcifications are present in the left common carotid artery without significant stenosis. Minimal calcifications are present at the proximal left ICA without significant stenosis. The cervical left ICA is otherwise normal. Vertebral arteries: Right vertebral artery is dominant. It originates from the is clear right subclavian artery without significant stenosis. The left vertebral artery is hypoplastic, originating from the aortic arch without focal stenosis. No significant stenosis is present in either vertebral artery in the neck. Skeleton: Multilevel degenerative  changes are present in the cervical spine. Slight anterolisthesis is present at C3-4. No focal osseous lesions are present. Other neck: Soft tissues the neck are otherwise unremarkable. Salivary glands are within normal limits. Thyroid is normal. No significant adenopathy is present. No focal mucosal or submucosal lesions are present. Upper chest: Scarring is present the lung apices bilaterally. No edema or effusion is present. Review of the MIP images confirms the above findings CTA HEAD FINDINGS Anterior circulation: Internal carotid arteries scratched at minimal calcifications are present within the cavernous internal carotid arteries bilaterally. No focal stenosis is present through the ICA termini. The A1 and M1 segments are scratched at the A1 segments are normal bilaterally. Left M1 segment and bifurcation is normal. Focal occlusion is present in the right M1 segment with relatively poor collateralization to the right MCA territory. The left MCA and bilateral ACA branch vessels are within normal limits. No aneurysm is present. Posterior circulation: PICA origins are visualized and normal bilaterally. Hypoplastic distal V4 segments are present. The basilar artery is small, terminating at the superior cerebellar arteries. The  posterior cerebral arteries are of fetal type bilaterally. PCA branch vessels are within normal limits bilaterally. No aneurysm is present. Venous sinuses: The dural sinuses are patent. The straight sinus and deep cerebral veins are intact. Cortical veins are within normal limits. No significant vascular malformation is evident. Anatomic variants: Fetal type posterior cerebral arteries bilaterally. Review of the MIP images confirms the above findings CT Brain Perfusion Findings: ASPECTS: 9/10 CBF (<30%) Volume: 8mL Perfusion (Tmax>6.0s) volume: Mismatch Volume: Infarction Location:Right lentiform nucleus. IMPRESSION: 1. Focal occlusion of the right M1 segment with relatively  poor collateralization to the right MCA territory. 2. 8 mL core infarct of the right lentiform nucleus with 110 mL of ischemic penumbra. 3. Atherosclerotic changes at the right carotid bifurcation and proximal left ICA without significant stenosis. 4. 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 The right vertebral artery is dominant. 5. Multilevel degenerative changes in the cervical spine. 6.  Aortic Atherosclerosis (ICD10-I70.0). Electronically Signed   By: Lonni Necessary M.D.   On: 01/11/2023 13:34   CT HEAD CODE STROKE WO CONTRAST Result Date: 01/11/2023 CLINICAL DATA:  Code stroke.  Left-sided weakness EXAM: CT HEAD WITHOUT CONTRAST TECHNIQUE: Contiguous axial images were obtained from the base of the skull through the vertex without intravenous contrast. RADIATION DOSE REDUCTION: This exam was performed according to the departmental dose-optimization program which includes automated exposure control, adjustment of the mA and/or kV according to patient size and/or use of iterative reconstruction technique. COMPARISON:  None Available. FINDINGS: Brain: There is a background of severe chronic microvascular ischemic change with multiple likely chronic underlying infarcts. There may be an acute infarcts involving the right lentiform nucleus (series 2, image 24). No hemorrhage. No hydrocephalus. No extra-axial fluid collection. No mass effect. No mass lesion. Vascular: Hyperdense right MCA. Skull: Normal. Negative for fracture or focal lesion. Sinuses/Orbits: No middle ear or mastoid effusion. Paranasal sinuses are clear. Bilateral lens replacement. Orbits are otherwise unremarkable. Other: None. ASPECTS ([place] Stroke Program Early CT Score): 9 IMPRESSION: 1. Possible acute infarct involving the right lentiform nucleus. No hemorrhage. ASPECTS 9 2. Hyperdense right MCA. 3. Background of severe chronic microvascular ischemic change with multiple underlying chronic infarcts.  Findings were paged to Dr. Arora on 01/11/2023 at 12:55 p.m. Electronically Signed   By: Lyndall Gore M.D.   On: 01/11/2023 12:57    Procedures .Critical Care  Performed by: Emil Share, DO Authorized by: Emil Share, DO   Critical care provider statement:    Critical care time (minutes):  35   Critical care time was exclusive of:  Separately billable procedures and treating other patients   Critical care was time spent personally by me on the following activities:  Development of treatment plan with patient or surrogate, discussions with consultants, evaluation of patient's response to treatment, examination of patient, ordering and review of laboratory studies, ordering and review of radiographic studies, ordering and performing treatments and interventions, pulse oximetry, re-evaluation of patient's condition and review of old charts   Care discussed with: admitting provider       Medications Ordered in ED Medications   stroke: early stages of recovery book (has no administration in time range)  0.9 %  sodium chloride  infusion (has no administration in time range)  acetaminophen  (TYLENOL ) tablet 650 mg (has no administration in time range)    Or  acetaminophen  (TYLENOL ) 160 MG/5ML solution 650 mg (has no administration in time range)    Or  acetaminophen  (TYLENOL ) suppository 650  mg (has no administration in time range)  senna-docusate (Senokot-S) tablet 1 tablet (has no administration in time range)  iohexol  (OMNIPAQUE ) 300 MG/ML solution 150 mL (has no administration in time range)  nitroGLYCERIN  25 mcg in sodium chloride  (PF) 0.9 % 59.71 mL (25 mcg/mL) syringe (25 mcg Intra-arterial Given 01/11/23 1446)  iohexol  (OMNIPAQUE ) 350 MG/ML injection 100 mL (100 mLs Intravenous Contrast Given 01/11/23 1259)    ED Course/ Medical Decision Making/ A&P                                 Medical Decision Making Amount and/or Complexity of Data Reviewed Labs: ordered. Radiology:  ordered.  Risk Decision regarding hospitalization.   89 yo F with a chief complaints of acute onset change in mental status.  Patient found to have a rightward gaze deviation arrived as a code stroke.  Airway cleared at the bridge.  Taken urgently back to CT.  Patient found to have a M1 occlusion.  Taken to IR.    The patients results and plan were reviewed and discussed.   Any x-rays performed were independently reviewed by myself.   Differential diagnosis were considered with the presenting HPI.  Medications   stroke: early stages of recovery book (has no administration in time range)  0.9 %  sodium chloride  infusion (has no administration in time range)  acetaminophen  (TYLENOL ) tablet 650 mg (has no administration in time range)    Or  acetaminophen  (TYLENOL ) 160 MG/5ML solution 650 mg (has no administration in time range)    Or  acetaminophen  (TYLENOL ) suppository 650 mg (has no administration in time range)  senna-docusate (Senokot-S) tablet 1 tablet (has no administration in time range)  iohexol  (OMNIPAQUE ) 300 MG/ML solution 150 mL (has no administration in time range)  nitroGLYCERIN  25 mcg in sodium chloride  (PF) 0.9 % 59.71 mL (25 mcg/mL) syringe (25 mcg Intra-arterial Given 01/11/23 1446)  iohexol  (OMNIPAQUE ) 350 MG/ML injection 100 mL (100 mLs Intravenous Contrast Given 01/11/23 1259)    Vitals:   01/11/23 1200  Weight: 57.8 kg    Final diagnoses:  Acute ischemic right MCA stroke [facility])    Admission/ observation were discussed with the admitting physician, patient and/or family and they are comfortable with the plan.            Final Clinical Impression(s) / ED Diagnoses Final diagnoses:  Acute ischemic right MCA stroke [facility])    Rx / DC Orders ED Discharge Orders     None         Emil Share, DO 01/11/23 1449

## 2023-01-11 NOTE — Procedures (Signed)
 INR. Status post right common carotid arteriogram.  Right CFA approach.  Findings.  1.  Occluded right middle cerebral artery proximal M1 segment.  Status post revascularization of occluded right middle cerebral M1 segment, with 1 pass with a 4 mm x 40 mm Solitaire X retrieval device and contact aspiration, 1 pass with the a 3 mm x 20 mm solitaire x retrieval device and contact aspiration and one pass with direct aspiration using an 062 aspiration catheter achieving a TICI  2C revascularization.  Post CT brain demonstrates no evidence of ICH . Hyperattenuation of the RT basal ganglia.  Manual compression held with quick clot for approximately 25 minutes for hemostasis of the right groin puncture site.  Closure device not used  due to significant atherosclerotic disease of the right common femoral artery.  Distal pulses present bilaterally unchanged.  Patient left intubated as per anesthesia due to patient's unresponsiveness prior to the procedure.  S.Jailine Lieder MD

## 2023-01-11 NOTE — Transfer of Care (Signed)
 Immediate Anesthesia Transfer of Care Note  Patient: Melissa Burton  Procedure(s) Performed: IR WITH ANESTHESIA  Patient Location: ICU  Anesthesia Type:General  Level of Consciousness: sedated and Patient remains intubated per anesthesia plan  Airway & Oxygen Therapy: Patient remains intubated per anesthesia plan and Patient placed on Ventilator (see vital sign flow sheet for setting)  Post-op Assessment: Report given to RN and Post -op Vital signs reviewed and stable  Post vital signs: Reviewed and stable  Last Vitals:  Vitals Value Taken Time  BP 145/78 01/11/23 1551  Temp 98.6   Pulse 98 01/11/23 1557  Resp 21 01/11/23 1557  SpO2 100 % 01/11/23 1557  Vitals shown include unfiled device data.  Last Pain: There were no vitals filed for this visit.       Complications:  Encounter Notable Events  Notable Event Outcome Phase Comment  Difficult to intubate - unexpected  Intraprocedure Filed from anesthesia note documentation.

## 2023-01-11 NOTE — ED Triage Notes (Signed)
 Pt BIB [facility] from home due to being found in corner scrunched up with right sided gaze.  Pt found not to have any strength in left arm; left side facial droop and right sided gaze.  Pt normally is very independent and walks without assistance or device.  LKW 1730 01/10/23.  Today 01/11/23 found at 1210 in corner.  EMS GCS 6.  Pt placed on 15 L oxygen due to SpO dropping to 90's RA.  BP 162/74, HR 90 a-fib, resp 22.  18g left forearm placed by [facility].

## 2023-01-11 NOTE — Anesthesia Procedure Notes (Addendum)
 Procedure Name: Intubation Date/Time: 01/11/2023 1:16 PM  Performed by: Harrold Macintosh, CRNAPre-anesthesia Checklist: Patient identified, Emergency Drugs available, Suction available and Patient being monitored Patient Re-evaluated:Patient Re-evaluated prior to induction Oxygen Delivery Method: Circle system utilized Preoxygenation: Pre-oxygenation with 100% oxygen Induction Type: IV induction and Rapid sequence Laryngoscope Size: Glidescope and 3 Grade View: Grade I Tube type: Oral Tube size: 7.0 mm Number of attempts: 2 Airway Equipment and Method: Stylet, Oral airway and Video-laryngoscopy Placement Confirmation: ETT inserted through vocal cords under direct vision, positive ETCO2 and breath sounds checked- equal and bilateral Secured at: 22 cm Tube secured with: Tape Dental Injury: Teeth and Oropharynx as per pre-operative assessment  Difficulty Due To: Difficult Airway- due to limited oral opening, Difficulty was unanticipated, Difficult Airway- due to anterior larynx and Difficult Airway- due to reduced neck mobility Comments: Palatal torus observed. 1st attempt limited mouth opening, Grade II view w/Miller 2 blade

## 2023-01-11 NOTE — H&P (Signed)
 NEUROLOGY H&P NOTE   Date of service: January 11, 2023 Patient Name: Melissa Burton MRN:  [id number] DOB:  Jun 02, 1932 Chief Complaint: CODE STROKE  History of Present Illness  Melissa Burton is a 89 y.o. female with a past medical history significant for hypertension, thyroid disease, arthritis, osteoporosis, cataracts who was found around noon this morning by her granddaughter down on the floor, minimally responsive.  EMS was called and code stroke was activated due to left-sided weakness, right gaze, decreased mental status.  She was placed on nonrebreather, at 15 L by EMS.  On exam at bridge, patient is minimally responsive, right gaze, left, Imus hemianopia, right left facial droop, left hemiparesis.  She was taken emergently to CAT scan which was negative for bleed CT angio and perfusion that showed a right M1 occlusion.   Emergent thrombectomy was explained to patient's granddaughter [name] Kerns, as were risks, benefits and alternatives by Dr. Voncile and Dr. Dolphus. All questions were answered. Patient's granddaughter expressed understanding of the treatment plan and agreed to proceed with emergent thrombectomy.  Last known well: 1730 1/8 Modified rankin score: 0-1 IV Thrombolysis: No outside of window Thrombectomy: Yes  NIHSS components Score: Comment  1a Level of Conscious 0[]  1[]  2[x]  3[]      1b LOC Questions 0[]  1[]  2[x]       1c LOC Commands 0[]  1[]  2[x]       2 Best Gaze 0[]  1[]  2[x]       3 Visual 0[]  1[]  2[x]  3[]      4 Facial Palsy 0[]  1[]  2[x]  3[]      5a Motor Arm - left 0[]  1[]  2[]  3[x]  4[]  UN[]    5b Motor Arm - Right 0[x]  1[]  2[]  3[]  4[]  UN[]    6a Motor Leg - Left 0[]  1[]  2[]  3[x]  4[]  UN[]    6b Motor Leg - Right 0[]  1[]  2[x]  3[]  4[]  UN[]    7 Limb Ataxia 0[x]  1[]  2[]  3[]  UN[x]     8 Sensory 0[]  1[x]  2[]  UN[]      9 Best Language 0[]  1[]  2[]  3[x]      10 Dysarthria 0[]  1[]  2[x]  UN[]      11 Extinct. and Inattention 0[]  1[x]  2[]       TOTAL:   27      ROS   Unable  to perform due to AMS  Past History   Past Medical History:  Diagnosis Date   Arthritis    Cataract    Hypertension    Osteoporosis    Thyroid disease    Past Surgical History:  Procedure Laterality Date   EYE SURGERY     Family History  Problem Relation Age of Onset   Diabetes Sister    Hyperlipidemia Sister    Diabetes Brother    Cancer Sister    Social History   Socioeconomic History   Marital status: Divorced    Spouse name: Not on file   Number of children: Not on file   Years of education: Not on file   Highest education level: Not on file  Occupational History   Not on file  Tobacco Use   Smoking status: Former   Smokeless tobacco: Not on file  Substance and Sexual Activity   Alcohol  use: No    Alcohol /week: 0.0 standard drinks of alcohol    Drug use: No   Sexual activity: Not on file  Other Topics Concern   Not on file  Social History Narrative   Not on file   Social Drivers of Health  Financial Resource Strain: Low Risk  (08/14/2022)   Received from [facility]   Overall Financial Resource Strain (CARDIA)    Difficulty of Paying Living Expenses: Not hard at all  Food Insecurity: No Food Insecurity (08/14/2022)   Received from [facility]   Hunger Vital Sign    Worried About Running Out of Food in the Last Year: Never true    Ran Out of Food in the Last Year: Never true  Transportation Needs: No Transportation Needs (08/14/2022)   Received from [facility] - Transportation    Lack of Transportation (Medical): No    Lack of Transportation (Non-Medical): No  Physical Activity: Unknown (08/14/2022)   Received from [facility]   Exercise Vital Sign    Days of Exercise per Week: 0 days    Minutes of Exercise per Session: Not on file  Stress: No Stress Concern Present (08/14/2022)   Received from [facility] - Occupational Stress Questionnaire    Feeling of Stress : Not at all  Social  Connections: Socially Integrated (08/14/2022)   Received from [facility]   Social Network    How would you rate your social network (family, work, friends)?: Good participation with social networks   No Known Allergies  Medications   Current Facility-Administered Medications:    [START ON 01/12/2023]  stroke: early stages of recovery book, , Does not apply, Once, Voncile Isles, MD   0.9 %  sodium chloride  infusion, , Intravenous, Continuous, Abryanna Musolino, MD   acetaminophen  (TYLENOL ) tablet 650 mg, 650 mg, Oral, Q4H PRN **OR** acetaminophen  (TYLENOL ) 160 MG/5ML solution 650 mg, 650 mg, Per Tube, Q4H PRN **OR** acetaminophen  (TYLENOL ) suppository 650 mg, 650 mg, Rectal, Q4H PRN, Jode Lippe, MD   senna-docusate (Senokot-S) tablet 1 tablet, 1 tablet, Oral, QHS PRN, Valicia Rief, MD  Current Outpatient Medications:    amLODipine (NORVASC) 10 MG tablet, Take 10 mg by mouth daily., Disp: , Rfl:    levothyroxine  (SYNTHROID , LEVOTHROID) 50 MCG tablet, Take 50 mcg by mouth daily before breakfast., Disp: , Rfl:    lisinopril (PRINIVIL,ZESTRIL) 20 MG tablet, Take 20 mg by mouth 2 (two) times daily., Disp: , Rfl:   Facility-Administered Medications Ordered in Other Encounters:    lidocaine  2% (20 mg/mL) 5 mL syringe, , Intravenous, Anesthesia Intra-op, Harrold Macintosh, CRNA, 60 mg at 01/11/23 1313   propofol  (DIPRIVAN ) 10 mg/mL bolus/IV push, , Intravenous, Anesthesia Intra-op, Harrold Macintosh, CRNA, 100 mg at 01/11/23 1313   rocuronium  (ZEMURON ) injection, , Intravenous, Anesthesia Intra-op, Harrold Macintosh, CRNA, 50 mg at 01/11/23 1323   succinylcholine  (ANECTINE ) syringe, , Intravenous, Anesthesia Intra-op, Harrold Macintosh, CRNA, 80 mg at 01/11/23 1313   Vitals   Vitals:   01/11/23 1200  Weight: 57.8 kg     Body mass index is 21.2 kg/m.  Physical Exam   Constitutional: Elderly female Psych:UTA Eyes: No scleral injection.  HENT: No OP obstruction.  Head: Normocephalic.   Cardiovascular: Normal rate and regular rhythm.  Respiratory: Shallow breathing, 15L NRB  Neurologic Examination   Neuro: Mental Status: Patient is minimally responsive.  Cranial Nerves: II: PERRL.  Left homonymous hemianopia III,IV, VI: Right gaze deviation, does not cross midline or track examiner V: Unable to assess facial sensation symmetry VII: Left facial droop VIII: UTA X: UTA XI: UTA XII: UTA Motor/Sensory: RUE: No drift present. Spontaneous non-purposeful movement.withdraws to pain, anti-gravity. LUE: No effort against gravity. No spontaneous movement. Only very slight withdraw to  pain.  RLE: Slight drift when lifted by examiner, withdraws to pain anti-gravity.  LLE: Increased tone. No effort against gravity. Only slight withdraw to pain.  Cerebellar: UTA d/t decreased mental status Gait: Deferred/ UTA   Labs   CBC:  Recent Labs  Lab 01/11/23 1248 01/11/23 1249  WBC 14.4*  --   NEUTROABS 12.6*  --   HGB 14.2 14.6  HCT 42.3 43.0  MCV 93.2  --   PLT 279  --    Basic Metabolic Panel:  Lab Results  Component Value Date   NA 135 01/11/2023   K 4.3 01/11/2023   CO2 22 07/13/2020   GLUCOSE 144 (H) 01/11/2023   BUN 18 01/11/2023   CREATININE 0.50 01/11/2023   CALCIUM  9.2 07/13/2020   GFRNONAA >60 07/13/2020   Lipid Panel: No results found for: LDLCALC HgbA1c: No results found for: HGBA1C Urine Drug Screen: No results found for: LABOPIA, COCAINSCRNUR, LABBENZ, AMPHETMU, THCU, LABBARB  Alcohol  Level     Component Value Date/Time   ETH <10 01/11/2023 1248   INR  Lab Results  Component Value Date   INR 1.1 01/11/2023   APTT  Lab Results  Component Value Date   APTT 25 01/11/2023     CT Head without contrast(Personally reviewed): - Possible acute artifact involving the right lentiform nucleus - No hemorrhage - ASPECTS 9 - Hyperdense right MCA  CT angio Head and Neck with perfusion (Personally reviewed): - Focal occlusion  of the right M1 segment, poor collateralization of the right MCA territory. - 8 mm later for infarct of the right lentiform nucleus, 110 mL of ischemic penumbra - Atherosclerotic changes of the right carotid bifurcation and proximal left ICA without significant stenosis  Assessment  TANGIE STAY is a 89 y.o. female with a past medical history significant for hypertension, thyroid disease, arthritis, osteoporosis, cataracts who was found around noon this morning by her granddaughter down on the floor, minimally responsive.  EMS was called and code stroke was activated due to left-sided weakness, right gaze, decreased mental status.  She was placed on nonrebreather, at 15 L by EMS.  On exam at bridge, patient is minimally responsive, right gaze, left, Imus hemianopia, right left facial droop, left hemiparesis.  She was taken emergently to CT scan which was negative for bleed. CT angio and perfusion showed a right M1 occlusion, with penumbra. She was then taken to IR for emergent thrombectomy.  Primary Diagnosis:  Cerebral infarction due to occlusion or stenosis of right middle cerebral artery. M1 segment  Secondary Diagnosis: Essential (primary) hypertension  Recommendations  - Frequent Neuro checks per stroke unit protocol post IR - MRI Brain stroke protocol, MRA head to evaluate patency of the opened vessel - TTE - Lipid panel - Statin - will be started if LDL>70 or otherwise medically indicated - A1C - Antithrombotic - per IR - DVT ppx - SCDs  - Blood Pressure Goal: SBP 120-160 for first 24 hours then less than 180  - Telemetry monitoring for arrhythmia - 72h - Swallow screen - will be performed prior to PO intake - Stroke education - will be given - PT/OT/SLP - CXR and UA to r/o infectious process such as aspiration pna or UTI  - Dispo: ICU ______________________________________________________________________   Pt seen by Neuro NP/APP and later by MD. Note/plan to be  edited by MD as needed.    Rocky JAYSON Likes, DNP, AGACNP-BC [facility] Please use AMION for contact information & EPIC for messaging.  Attending Neurohospitalist Addendum Patient seen and examined with APP/Resident. Agree with the history and physical as documented above. Agree with the plan as documented, which I helped formulate. I have independently reviewed the chart, obtained history, review of systems and examined the patient.I have personally reviewed pertinent head/neck/spine imaging (CT/MRI).  Patient independently seen and examined at the time of the code stroke evaluation.  History obtained from the granddaughter over the phone with last known well 5:30 PM yesterday.  Found down this afternoon when she went to check on her. On examination in the emergency department, the patient had a full right MCA syndrome with right gaze deviation, left hemianopsia, left facial weakness as well as left hemiparesis.  She was also unable to follow commands. Her NIH was higher than what I would expect but I am not sure if there is an infectious process on top of the stroke going on. CT head within aspects 9 concerning for a right MCA stroke and denser looking right MCA than left MCA. CT angiography head and neck confirmed a right M1 occlusion. CT perfusion showed favorable large penumbra. Discussion for thrombectomy had with granddaughter regarding risks benefits and alternatives of thrombectomy and she agreed to proceed after detailed discussion. I had code stroke activated and patient taken for thrombectomy Will be admitted to the ICU post IR for post IR care and stroke risk factor workup.  Discussed with patient's granddaughter over the phone, Dr. Rolan Quale, EDP, and Dr. Dolphus, neurointerventional radiology.   Imaging reviewed prior to referring for procedure-CT reviewed personally.  CT angio reviewed personally Risks benefits and alternatives of the procedure were discussed  with the family and then they had a detailed discussion with Dr. Dolphus over the phone, witnessed by stroke response nurse   Please feel free to call with any questions.  -- Eligio Lav, MD Neurologist [facility] Pager: [phone]  CRITICAL CARE ATTESTATION Performed by: Eligio Lav, MD Total critical care time: 45 minutes Critical care time was exclusive of separately billable procedures and treating other patients and/or supervising APPs/Residents/Students Critical care was necessary to treat or prevent imminent or life-threatening deterioration. This patient is critically ill and at significant risk for neurological worsening and/or death and care requires constant monitoring. Critical care was time spent personally by me on the following activities: development of treatment plan with patient and/or surrogate as well as nursing, discussions with consultants, evaluation of patient's response to treatment, examination of patient, obtaining history from patient or surrogate, ordering and performing treatments and interventions, ordering and review of laboratory studies, ordering and review of radiographic studies, pulse oximetry, re-evaluation of patient's condition, participation in multidisciplinary rounds and medical decision making of high complexity in the care of this patient.

## 2023-01-11 NOTE — Anesthesia Preprocedure Evaluation (Addendum)
 Anesthesia Evaluation  Patient identified by MRN, date of birth, ID band Patient unresponsive    Reviewed: Allergy & Precautions, NPO status , Patient's Chart, lab work & pertinent test results  Airway Mallampati: II  TM Distance: >3 FB Neck ROM: Full    Dental  (+) Teeth Intact, Dental Advisory Given   Pulmonary neg pulmonary ROS   Pulmonary exam normal breath sounds clear to auscultation       Cardiovascular hypertension, Pt. on medications  Rhythm:Regular Rate:Tachycardia     Neuro/Psych CVA, Residual Symptoms    GI/Hepatic negative GI ROS, Neg liver ROS,,,  Endo/Other  Hypothyroidism    Renal/GU negative Renal ROS     Musculoskeletal  (+) Arthritis ,    Abdominal   Peds  Hematology negative hematology ROS (+)   Anesthesia Other Findings Day of surgery medications reviewed with the patient.  Reproductive/Obstetrics                             Anesthesia Physical Anesthesia Plan  ASA: 3 and emergent  Anesthesia Plan: General   Post-op Pain Management:    Induction: Intravenous  PONV Risk Score and Plan: 3  Airway Management Planned: Oral ETT and Video Laryngoscope Planned  Additional Equipment:   Intra-op Plan:   Post-operative Plan: Post-operative intubation/ventilation  Informed Consent: I have reviewed the patients History and Physical, chart, labs and discussed the procedure including the risks, benefits and alternatives for the proposed anesthesia with the patient or authorized representative who has indicated his/her understanding and acceptance.     Dental advisory given  Plan Discussed with: CRNA  Anesthesia Plan Comments:        Anesthesia Quick Evaluation

## 2023-01-11 NOTE — Progress Notes (Signed)
 Orthopedic Tech Progress Note Patient Details:  Melissa Burton 1932-01-18 [id number]  Pt already has a knee immobilizer from IR procedure.  Patient ID: Melissa Burton, female   DOB: 1932-05-29, 89 y.o.   MRN: [id number]  [name] 01/11/2023, 7:06 PM

## 2023-01-11 NOTE — Progress Notes (Signed)
 Patient hypotensive with SBP in 80's. Notified E-link. Awaiting orders.

## 2023-01-11 NOTE — Progress Notes (Signed)
 SLP Cancellation Note  Patient Details Name: Melissa Burton MRN: [id number] DOB: 1932/04/23   Cancelled treatment:       Reason Eval/Treat Not Completed: Patient not medically ready. Pt remains intubated s/p R carotid arteriogram. SLP will continue following.    Damien Blumenthal, M.A., CF-SLP Speech Language Pathology, Acute Rehabilitation Services  Secure Chat preferred [phone]  01/11/2023, 4:02 PM

## 2023-01-11 NOTE — Anesthesia Procedure Notes (Addendum)
 Arterial Line Insertion Start/End1/09/2023 1:17 AM, 01/11/2023 1:27 PM Performed by: Corinne Garnette BRAVO, MD, anesthesiologist  Patient location: OOR procedure area. Emergency situation Patient sedated Left, radial was placed Catheter size: 20 G Hand hygiene performed  and maximum sterile barriers used  Allen's test indicative of satisfactory collateral circulation Attempts: 2 Procedure performed using ultrasound guided technique. Following insertion, Biopatch and dressing applied. Post procedure assessment: normal and unchanged  Patient tolerated the procedure well with no immediate complications. Additional procedure comments: 1st attempt by Vernell Molt, CRNA.

## 2023-01-11 NOTE — Sedation Documentation (Signed)
 Care per anesthesia at this time; Manley Mason, CRNA at bedside.

## 2023-01-11 NOTE — Code Documentation (Signed)
 Stroke Response Nurse Documentation Code Documentation  Melissa Burton is a 89 y.o. female arriving to [facility]  via [facility] on 01/11/2023 with past medical hx of HTN, HLD, thyroid disease, arthritis, osteoporosis, cataracts. On No antithrombotic. Code stroke was activated by EMS.   Patient from home where she was LKW at 1730 01/10/2023 and now complaining of altered mental status and right gaze. Per EMS, patient was last seen normal at 1730 on 01/10/2023 and was found today at 1210 by her grandaughter. Patient was found down scrunched up in the corner.   Stroke team at the bedside on patient arrival. Labs drawn and patient cleared for CT by Dr. Emil. Patient to CT with team. NIHSS 27, see documentation for details and code stroke times. Patient with decreased LOC, disoriented, not following commands, right gaze preference , left hemianopia, left facial droop, left arm weakness, bilateral leg weakness, left decreased sensation, Global aphasia , dysarthria , and left neglect on exam. The following imaging was completed:  CT Head, CTA, and CTP. Patient is not a candidate for IV Thrombolytic due to out of window for iv thrombolytic per MD. Patient is not a candidate for IR due to LVO noted on imaging per MD.   Care Plan: Patient taken to IR.   Bedside handoff with IR RN Margarie.    [name]  Stroke Response RN

## 2023-01-11 NOTE — Consult Note (Signed)
 NAME:  Melissa Burton, MRN:  [id number], DOB:  [birth year], LOS: 0 ADMISSION DATE:  01/11/2023, CONSULTATION DATE: 01/11/2023 REFERRING MD: Dr. Voncile, CHIEF COMPLAINT: Left-sided weakness  History of Present Illness:  89 year old female with hypertension was brought into the emergency department after he was found down by his granddaughter, code stroke was activated as patient was noted to have left-sided weakness, left facial droop with right gaze deviation CT head was negative, CTA head and neck showed occluded right M1, patient underwent mechanical thrombectomy with TICI 2C resolved, remains intubated, PCCM was consulted for medical management.  Pertinent  Medical History   Past Medical History:  Diagnosis Date   Arthritis    Cataract    Hypertension    Osteoporosis    Thyroid disease      Significant Hospital Events: Including procedures, antibiotic start and stop dates in addition to other pertinent events     Interim History / Subjective:  As above  Objective   Weight 57.8 kg.        Intake/Output Summary (Last 24 hours) at 01/11/2023 1528 Last data filed at 01/11/2023 1500 Gross per 24 hour  Intake 250 ml  Output --  Net 250 ml   Filed Weights   01/11/23 1200  Weight: 57.8 kg    Examination: General: Crtitically ill-appearing female, orally intubated HEENT: [place]/AT, eyes anicteric.  ETT and OGT in place Neuro: Sedated, not following commands.  Eyes are closed.  Pupils 3 mm bilateral reactive to light Chest: Coarse breath sounds, no wheezes or rhonchi Heart: Irregularly irregular, no murmurs or gallops Abdomen: Soft, nondistended, bowel sounds present Skin: No rash   Labs and images were reviewed  Resolved Hospital Problem list     Assessment & Plan:  Acute right MCA territory stroke s/p mechanical thrombectomy Continue neuro watch every hour Stroke team is following Continue secondary stroke prophylaxis Echocardiogram and MRI brain pending Maintain SBP  120-140 Follow-up A1c and lipid panel Continue aspirin  and statin  Paroxysmal A-fib Patient was noted to be in A-fib with controlled rate This might be reason she had acute stroke Will start anticoagulation once MRI is done tomorrow.  Acute respiratory insufficiency postprocedure Continue lung protective ventilation VAP prevention bundle in place SBT in a.m.  Hypertension Continue clevidipine  infusion with SBP goal 120-140 for now then permissive hypertension based on repeat head CT    Best Practice (right click and Reselect all SmartList Selections daily)   Diet/type: NPO DVT prophylaxis SCD Pressure ulcer(s): N/A GI prophylaxis: PPI Lines: N/A Foley:  Yes, and it is still needed Code Status:  full code Last date of multidisciplinary goals of care discussion [Per primary team]  Labs   CBC: Recent Labs  Lab 01/11/23 1248 01/11/23 1249  WBC 14.4*  --   NEUTROABS 12.6*  --   HGB 14.2 14.6  HCT 42.3 43.0  MCV 93.2  --   PLT 279  --     Basic Metabolic Panel: Recent Labs  Lab 01/11/23 1248 01/11/23 1249  NA 133* 135  K 4.2 4.3  CL 100 100  CO2 19*  --   GLUCOSE 138* 144*  BUN 16 18  CREATININE 0.69 0.50  CALCIUM  9.2  --    GFR: CrCl cannot be calculated (Unknown ideal weight.). Recent Labs  Lab 01/11/23 1248  WBC 14.4*    Liver Function Tests: Recent Labs  Lab 01/11/23 1248  AST 57*  ALT 28  ALKPHOS 120  BILITOT 1.1  PROT 7.0  ALBUMIN   3.9   No results for input(s): LIPASE, AMYLASE in the last 168 hours. No results for input(s): AMMONIA in the last 168 hours.  ABG    Component Value Date/Time   TCO2 23 01/11/2023 1249     Coagulation Profile: Recent Labs  Lab 01/11/23 1248  INR 1.1    Cardiac Enzymes: No results for input(s): CKTOTAL, CKMB, CKMBINDEX, TROPONINI in the last 168 hours.  HbA1C: No results found for: HGBA1C  CBG: Recent Labs  Lab 01/11/23 1244  GLUCAP 140*    Review of Systems:    Unable to obtain as patient is intubated and sedated  Past Medical History:  She,  has a past medical history of Arthritis, Cataract, Hypertension, Osteoporosis, and Thyroid disease.   Surgical History:   Past Surgical History:  Procedure Laterality Date   EYE SURGERY       Social History:   reports that she has quit smoking. She does not have any smokeless tobacco history on file. She reports that she does not drink alcohol  and does not use drugs.   Family History:  Her family history includes Cancer in her sister; Diabetes in her brother and sister; Hyperlipidemia in her sister.   Allergies No Known Allergies   Home Medications  Prior to Admission medications   Medication Sig Start Date End Date Taking? Authorizing Provider  atorvastatin  (LIPITOR) 10 MG tablet Take 1 tablet by mouth daily. 11/02/22  Yes [provider]  metoprolol  succinate (TOPROL -XL) 25 MG 24 hr tablet Take 1 tablet by mouth daily. 11/02/22  Yes [provider]  Vitamin D, Ergocalciferol, (DRISDOL) 1.25 MG (50000 UNIT) CAPS capsule Take by mouth. 11/02/22 02/02/23 Yes [provider]  amLODipine (NORVASC) 10 MG tablet Take 10 mg by mouth daily.    [provider]  levothyroxine  (SYNTHROID , LEVOTHROID) 50 MCG tablet Take 50 mcg by mouth daily before breakfast.    [provider]  lisinopril (PRINIVIL,ZESTRIL) 20 MG tablet Take 20 mg by mouth 2 (two) times daily.    [provider]     Critical care time:      The patient is critically ill due to acute right MCA ischemic stroke/acute respiratory insufficiency requiring titration of ventilator.  Critical care was necessary to treat or prevent imminent or life-threatening deterioration.  Critical care was time spent personally by me on the following activities: development of treatment plan with patient and/or surrogate as well as nursing, discussions with consultants, evaluation of patient's response to  treatment, examination of patient, obtaining history from patient or surrogate, ordering and performing treatments and interventions, ordering and review of laboratory studies, ordering and review of radiographic studies, pulse oximetry, re-evaluation of patient's condition and participation in multidisciplinary rounds.   During this encounter critical care time was devoted to patient care services described in this note for 37 minutes.     Valinda Novas, MD [facility] See Amion for pager If no response to pager, please call [phone] until 7pm After 7pm, Please call E-link [phone]

## 2023-01-11 NOTE — Anesthesia Postprocedure Evaluation (Signed)
 Anesthesia Post Note  Patient: Melissa Burton  Procedure(s) Performed: IR WITH ANESTHESIA     Patient location during evaluation: ICU Anesthesia Type: General Level of consciousness: patient remains intubated per anesthesia plan Pain management: pain level controlled Vital Signs Assessment: post-procedure vital signs reviewed and stable Respiratory status: patient on ventilator - see flowsheet for VS Cardiovascular status: blood pressure returned to baseline and stable Postop Assessment: no apparent nausea or vomiting Anesthetic complications: no     Last Vitals:  Vitals:   01/11/23 1549  SpO2: 100%    Last Pain: There were no vitals filed for this visit.               Melissa Burton

## 2023-01-12 ENCOUNTER — Encounter (HOSPITAL_COMMUNITY): Payer: Self-pay | Admitting: Student in an Organized Health Care Education/Training Program

## 2023-01-12 ENCOUNTER — Inpatient Hospital Stay (HOSPITAL_COMMUNITY): Payer: Medicare Other

## 2023-01-12 DIAGNOSIS — I63511 Cerebral infarction due to unspecified occlusion or stenosis of right middle cerebral artery: Secondary | ICD-10-CM

## 2023-01-12 DIAGNOSIS — E43 Unspecified severe protein-calorie malnutrition: Secondary | ICD-10-CM | POA: Insufficient documentation

## 2023-01-12 DIAGNOSIS — I5030 Unspecified diastolic (congestive) heart failure: Secondary | ICD-10-CM

## 2023-01-12 DIAGNOSIS — I480 Paroxysmal atrial fibrillation: Secondary | ICD-10-CM

## 2023-01-12 DIAGNOSIS — I10 Essential (primary) hypertension: Secondary | ICD-10-CM

## 2023-01-12 DIAGNOSIS — I639 Cerebral infarction, unspecified: Secondary | ICD-10-CM | POA: Diagnosis not present

## 2023-01-12 LAB — POCT I-STAT 7, (LYTES, BLD GAS, ICA,H+H)
Acid-base deficit: 3 mmol/L — ABNORMAL HIGH (ref 0.0–2.0)
Bicarbonate: 21.2 mmol/L (ref 20.0–28.0)
Calcium, Ion: 1.13 mmol/L — ABNORMAL LOW (ref 1.15–1.40)
HCT: 30 % — ABNORMAL LOW (ref 36.0–46.0)
Hemoglobin: 10.2 g/dL — ABNORMAL LOW (ref 12.0–15.0)
O2 Saturation: 100 %
Patient temperature: 96
Potassium: 3.4 mmol/L — ABNORMAL LOW (ref 3.5–5.1)
Sodium: 137 mmol/L (ref 135–145)
TCO2: 22 mmol/L (ref 22–32)
pCO2 arterial: 30.7 mm[Hg] — ABNORMAL LOW (ref 32–48)
pH, Arterial: 7.44 (ref 7.35–7.45)
pO2, Arterial: 160 mm[Hg] — ABNORMAL HIGH (ref 83–108)

## 2023-01-12 LAB — CBC WITH DIFFERENTIAL/PLATELET
Abs Immature Granulocytes: 0.04 10*3/uL (ref 0.00–0.07)
Basophils Absolute: 0 10*3/uL (ref 0.0–0.1)
Basophils Relative: 0 %
Eosinophils Absolute: 0 10*3/uL (ref 0.0–0.5)
Eosinophils Relative: 0 %
HCT: 32.5 % — ABNORMAL LOW (ref 36.0–46.0)
Hemoglobin: 10.9 g/dL — ABNORMAL LOW (ref 12.0–15.0)
Immature Granulocytes: 0 %
Lymphocytes Relative: 7 %
Lymphs Abs: 0.8 10*3/uL (ref 0.7–4.0)
MCH: 31.1 pg (ref 26.0–34.0)
MCHC: 33.5 g/dL (ref 30.0–36.0)
MCV: 92.6 fL (ref 80.0–100.0)
Monocytes Absolute: 0.9 10*3/uL (ref 0.1–1.0)
Monocytes Relative: 9 %
Neutro Abs: 9 10*3/uL — ABNORMAL HIGH (ref 1.7–7.7)
Neutrophils Relative %: 84 %
Platelets: 216 10*3/uL (ref 150–400)
RBC: 3.51 MIL/uL — ABNORMAL LOW (ref 3.87–5.11)
RDW: 12.8 % (ref 11.5–15.5)
WBC: 10.7 10*3/uL — ABNORMAL HIGH (ref 4.0–10.5)
nRBC: 0 % (ref 0.0–0.2)

## 2023-01-12 LAB — URINALYSIS, ROUTINE W REFLEX MICROSCOPIC
Bacteria, UA: NONE SEEN
Bilirubin Urine: NEGATIVE
Glucose, UA: 50 mg/dL — AB
Ketones, ur: 20 mg/dL — AB
Leukocytes,Ua: NEGATIVE
Nitrite: NEGATIVE
Protein, ur: 100 mg/dL — AB
Specific Gravity, Urine: >1.046 — ABNORMAL HIGH (ref 1.005–1.030)
pH: 5 (ref 5.0–8.0)

## 2023-01-12 LAB — RAPID URINE DRUG SCREEN, HOSP PERFORMED
Amphetamines: NOT DETECTED
Barbiturates: NOT DETECTED
Benzodiazepines: NOT DETECTED
Cocaine: NOT DETECTED
Opiates: NOT DETECTED
Tetrahydrocannabinol: NOT DETECTED

## 2023-01-12 LAB — LIPID PANEL
Cholesterol: 107 mg/dL (ref 0–200)
HDL: 39 mg/dL — ABNORMAL LOW (ref >40)
LDL Cholesterol: 50 mg/dL (ref 0–99)
Total CHOL/HDL Ratio: 2.7 {ratio}
Triglycerides: 88 mg/dL (ref <150)
VLDL: 18 mg/dL (ref 0–40)

## 2023-01-12 LAB — ECHOCARDIOGRAM COMPLETE
AR max vel: 1.66 cm2
AV Area VTI: 1.67 cm2
AV Area mean vel: 1.47 cm2
AV Mean grad: 3 mm[Hg]
AV Peak grad: 4.7 mm[Hg]
Ao pk vel: 1.08 m/s
Area-P 1/2: 5.66 cm2
Calc EF: 59.1 %
Height: 65 in
MV VTI: 1.8 cm2
S' Lateral: 2.3 cm
Single Plane A2C EF: 56.6 %
Single Plane A4C EF: 57.8 %
Weight: 2038.81 [oz_av]

## 2023-01-12 LAB — BASIC METABOLIC PANEL
Anion gap: 10 (ref 5–15)
BUN: 16 mg/dL (ref 8–23)
CO2: 21 mmol/L — ABNORMAL LOW (ref 22–32)
Calcium: 8 mg/dL — ABNORMAL LOW (ref 8.9–10.3)
Chloride: 105 mmol/L (ref 98–111)
Creatinine, Ser: 0.75 mg/dL (ref 0.44–1.00)
GFR, Estimated: >60 mL/min (ref >60)
Glucose, Bld: 102 mg/dL — ABNORMAL HIGH (ref 70–99)
Potassium: 3.5 mmol/L (ref 3.5–5.1)
Sodium: 136 mmol/L (ref 135–145)

## 2023-01-12 LAB — GLUCOSE, CAPILLARY
Glucose-Capillary: 105 mg/dL — ABNORMAL HIGH (ref 70–99)
Glucose-Capillary: 142 mg/dL — ABNORMAL HIGH (ref 70–99)
Glucose-Capillary: 154 mg/dL — ABNORMAL HIGH (ref 70–99)

## 2023-01-12 LAB — MAGNESIUM
Magnesium: 2 mg/dL (ref 1.7–2.4)
Magnesium: 1.9 mg/dL (ref 1.7–2.4)

## 2023-01-12 LAB — PHOSPHORUS
Phosphorus: 3.2 mg/dL (ref 2.5–4.6)
Phosphorus: 2.7 mg/dL (ref 2.5–4.6)

## 2023-01-12 LAB — TRIGLYCERIDES: Triglycerides: 88 mg/dL (ref <150)

## 2023-01-12 MED ORDER — PIVOT 1.5 CAL PO LIQD
1000.0000 mL | ORAL | Status: DC
  Administered 2023-01-13: 1000 mL

## 2023-01-12 MED ORDER — METOPROLOL TARTRATE 5 MG/5ML IV SOLN: 2.5000 mg | INTRAVENOUS | Status: DC | PRN

## 2023-01-12 MED ORDER — FAMOTIDINE 20 MG PO TABS
20.0000 mg | ORAL_TABLET | Freq: Every day | ORAL | Status: DC
  Administered 2023-01-12 – 2023-01-13 (×2): 20 mg
  Filled 2023-01-12 (×2): qty 1

## 2023-01-12 MED ORDER — POTASSIUM CHLORIDE 20 MEQ PO PACK
40.0000 meq | PACK | Freq: Once | ORAL | Status: AC
  Administered 2023-01-12: 40 meq
  Filled 2023-01-12: qty 2

## 2023-01-12 MED ORDER — ATORVASTATIN CALCIUM 10 MG PO TABS
10.0000 mg | ORAL_TABLET | Freq: Every day | ORAL | Status: DC
  Administered 2023-01-12 – 2023-01-13 (×2): 10 mg
  Filled 2023-01-12 (×2): qty 1

## 2023-01-12 MED ORDER — ASPIRIN 81 MG PO CHEW
81.0000 mg | CHEWABLE_TABLET | Freq: Every day | ORAL | Status: DC
  Administered 2023-01-12 – 2023-01-13 (×2): 81 mg
  Filled 2023-01-12 (×2): qty 1

## 2023-01-12 MED ORDER — NOREPINEPHRINE 4 MG/250ML-% IV SOLN
0.0000 ug/min | INTRAVENOUS | Status: DC
  Administered 2023-01-12: 1 ug/min via INTRAVENOUS
  Filled 2023-01-12: qty 250

## 2023-01-12 MED ORDER — JUVEN PO PACK
1.0000 | PACK | Freq: Two times a day (BID) | ORAL | Status: DC
  Administered 2023-01-12 – 2023-01-13 (×2): 1
  Filled 2023-01-12 (×2): qty 1

## 2023-01-12 MED ORDER — PIVOT 1.5 CAL PO LIQD
1000.0000 mL | ORAL | Status: DC
  Administered 2023-01-12: 1000 mL

## 2023-01-12 MED ORDER — INSULIN ASPART 100 UNIT/ML IJ SOLN
0.0000 [IU] | INTRAMUSCULAR | Status: DC
  Administered 2023-01-12: 1 [IU] via SUBCUTANEOUS
  Administered 2023-01-12: 2 [IU] via SUBCUTANEOUS
  Administered 2023-01-13 (×2): 1 [IU] via SUBCUTANEOUS

## 2023-01-12 MED ORDER — THIAMINE MONONITRATE 100 MG PO TABS
100.0000 mg | ORAL_TABLET | Freq: Every day | ORAL | Status: DC
  Administered 2023-01-12 – 2023-01-13 (×2): 100 mg
  Filled 2023-01-12 (×2): qty 1

## 2023-01-12 MED ORDER — CHLORHEXIDINE GLUCONATE CLOTH 2 % EX PADS: 6.0000 | MEDICATED_PAD | Freq: Every day | CUTANEOUS | Status: DC

## 2023-01-12 MED ORDER — SODIUM CHLORIDE 0.9 % IV SOLN: 250.0000 mL | INTRAVENOUS | Status: DC

## 2023-01-12 MED ORDER — LACTATED RINGERS IV BOLUS
500.0000 mL | Freq: Once | INTRAVENOUS | Status: AC
  Administered 2023-01-12: 500 mL via INTRAVENOUS

## 2023-01-12 MED ORDER — ENOXAPARIN SODIUM 40 MG/0.4ML IJ SOSY
40.0000 mg | PREFILLED_SYRINGE | INTRAMUSCULAR | Status: DC
  Administered 2023-01-12 – 2023-01-13 (×2): 40 mg via SUBCUTANEOUS
  Filled 2023-01-12 (×2): qty 0.4

## 2023-01-12 MED ORDER — LABETALOL HCL 5 MG/ML IV SOLN: 20.0000 mg | INTRAVENOUS | Status: DC | PRN

## 2023-01-12 MED ORDER — PROSOURCE TF20 ENFIT COMPATIBL EN LIQD
60.0000 mL | Freq: Every day | ENTERAL | Status: DC
  Administered 2023-01-12: 60 mL
  Filled 2023-01-12: qty 60

## 2023-01-12 NOTE — Progress Notes (Signed)
 OT Cancellation Note  Patient Details Name: Melissa Burton MRN: [id number] DOB: 1932/05/15   Cancelled Treatment:    Reason Eval/Treat Not Completed: Medical issues which prohibited therapy.  Pt still intubated and sedated at this time and not ready for OT eval per nursing report.  Will check back tomorrow for appropriateness.    Lether Tesch OTR/L 01/12/2023, 12:11 PM

## 2023-01-12 NOTE — Progress Notes (Signed)
 Pt was transported to MRI and back via ventilator with no apparent complications. Pt was placed on full support throughout the trip.Pt is back on 4N ICU and is back on PS/CPAP mode.

## 2023-01-12 NOTE — Progress Notes (Signed)
 BLE venous duplex has been completed.    Results can be found under chart review under CV PROC. 01/12/2023 1:30 PM Raylynne Cubbage RVT, RDMS

## 2023-01-12 NOTE — Procedures (Signed)
 Cortrak  Person Inserting Tube:  Katrinka Millman D, RD Tube Type:  Cortrak - 43 inches Tube Size:  10 Tube Location:  Right nare Secured by: Bridle Technique Used to Measure Tube Placement:  Marking at nare/corner of mouth Cortrak Secured At:  68 cm Procedure Comments:  Cortrak Tube Team Note:  Consult received to place a Cortrak feeding tube.   X-ray is required, abdominal x-ray has been ordered by the Cortrak team. Please confirm tube placement before using the Cortrak tube.   If the tube becomes dislodged please keep the tube and contact the Cortrak team at [email or web address] for replacement.  If after hours and replacement cannot be delayed, place a NG tube and confirm placement with an abdominal x-ray.    Millman Katrinka, RD, LDN Registered Dietitian II Please reach out via secure chat Weekend on-call pager # available in [facility]

## 2023-01-12 NOTE — TOC CM/SW Note (Signed)
 Transition of Care [facility]) - Inpatient Brief Assessment   Patient Details  Name: Melissa Burton MRN: [id number] Date of Birth: May 17, 1932  Transition of Care [facility]) CM/SW Contact:    Inocente GORMAN Kindle, LCSW Phone Number: 01/12/2023, 9:05 AM   Clinical Narrative: Patient admitted from home undergoing stroke workup. TOC following for any potential therapy needs.    Transition of Care Asessment: Insurance and Status: Insurance coverage has been reviewed Patient has primary care physician: Yes Home environment has been reviewed: From home Prior level of function:: Independent Prior/Current Home Services: No current home services Social Drivers of Health Review: SDOH reviewed no interventions necessary Readmission risk has been reviewed: Yes Transition of care needs: transition of care needs identified, TOC will continue to follow

## 2023-01-12 NOTE — Progress Notes (Addendum)
 STROKE TEAM PROGRESS NOTE   SIGNIFICANT HOSPITAL EVENTS 1/9- IR for mechanical thrombectomy   INTERIM HISTORY/SUBJECTIVE MRI completed today.  Intubated with left gaze preference, no movement on left upper extremity.  Localizes to painful stimuli on the left upper and bilateral lower extremities.  Requiring Cleviprex  for blood pressure control as needed labetalol  ordered.  Additionally she is in A-fib, we will start aspirin  81 mg today and continue repeat a head CT tomorrow morning.  OBJECTIVE  CBC    Component Value Date/Time   WBC 10.7 (H) 01/12/2023 0530   RBC 3.51 (L) 01/12/2023 0530   HGB 10.9 (L) 01/12/2023 0530   HCT 32.5 (L) 01/12/2023 0530   PLT 216 01/12/2023 0530   MCV 92.6 01/12/2023 0530   MCH 31.1 01/12/2023 0530   MCHC 33.5 01/12/2023 0530   RDW 12.8 01/12/2023 0530   LYMPHSABS 0.8 01/12/2023 0530   MONOABS 0.9 01/12/2023 0530   EOSABS 0.0 01/12/2023 0530   BASOSABS 0.0 01/12/2023 0530    BMET    Component Value Date/Time   NA 136 01/12/2023 0530   K 3.5 01/12/2023 0530   CL 105 01/12/2023 0530   CO2 21 (L) 01/12/2023 0530   GLUCOSE 102 (H) 01/12/2023 0530   BUN 16 01/12/2023 0530   CREATININE 0.75 01/12/2023 0530   CALCIUM  8.0 (L) 01/12/2023 0530   GFRNONAA >60 01/12/2023 0530    IMAGING past 24 hours ECHOCARDIOGRAM COMPLETE Result Date: 01/12/2023    ECHOCARDIOGRAM REPORT   Patient Name:   CARALYNN GELBER Date of Exam: 01/12/2023 Medical Rec #:  [id number]        Height:       65.0 in Accession #:    [id number]       Weight:       127.4 lb Date of Birth:  21-Jun-1932        BSA:          1.633 m Patient Age:    89 years         BP:           123/70 mmHg Patient Gender: F                HR:           95 bpm. Exam Location:  Inpatient Procedure: 2D Echo, Cardiac Doppler and Color Doppler Indications:    Stroke  History:        Patient has no prior history of Echocardiogram examinations.  Sonographer:    Juanita Shaw Referring Phys: [id number] ASHISH ARORA  IMPRESSIONS  1. Hyperdynamic LV. Mild intracavitary gradient of . Left ventricular ejection fraction, by estimation, is 70 to 75%. The left ventricle has hyperdynamic function. The left ventricle has no regional wall motion abnormalities. There is mild concentric left ventricular hypertrophy. Left ventricular diastolic parameters are consistent with Grade I diastolic dysfunction (impaired relaxation).  2. Right ventricular systolic function is mildly reduced. The right ventricular size is normal. There is mildly elevated pulmonary artery systolic pressure.  3. Left atrial size was moderately dilated.  4. The mitral valve is normal in structure. Trivial mitral valve regurgitation. No evidence of mitral stenosis.  5. Tricuspid valve regurgitation is moderate.  6. The aortic valve is normal in structure. Aortic valve regurgitation is not visualized. No aortic stenosis is present. FINDINGS  Left Ventricle: Hyperdynamic LV. Mild intracavitary gradient of . Left ventricular ejection fraction, by estimation, is 70 to 75%. The left ventricle has hyperdynamic function. The left ventricle  has no regional wall motion abnormalities. The left ventricular internal cavity size was small. There is mild concentric left ventricular hypertrophy. Left ventricular diastolic parameters are consistent with Grade I diastolic dysfunction (impaired relaxation). Right Ventricle: The right ventricular size is normal. No increase in right ventricular wall thickness. Right ventricular systolic function is mildly reduced. There is mildly elevated pulmonary artery systolic pressure. The tricuspid regurgitant velocity  is 2.71 m/s, and with an assumed right atrial pressure of 8 mmHg, the estimated right ventricular systolic pressure is 37.4 mmHg. Left Atrium: Left atrial size was moderately dilated. Right Atrium: Right atrial size was normal in size. Pericardium: There is no evidence of pericardial effusion. Mitral Valve: The mitral valve  is normal in structure. There is mild thickening of the mitral valve leaflet(s). Trivial mitral valve regurgitation. No evidence of mitral valve stenosis. MV peak gradient, 4.0 mmHg. The mean mitral valve gradient is 1.0 mmHg. Tricuspid Valve: The tricuspid valve is normal in structure. Tricuspid valve regurgitation is moderate . No evidence of tricuspid stenosis. Aortic Valve: The aortic valve is normal in structure. Aortic valve regurgitation is not visualized. No aortic stenosis is present. Aortic valve mean gradient measures 3.0 mmHg. Aortic valve peak gradient measures 4.7 mmHg. Aortic valve area, by VTI measures 1.67 cm. Pulmonic Valve: The pulmonic valve was normal in structure. Pulmonic valve regurgitation is trivial. No evidence of pulmonic stenosis. Aorta: The aortic root is normal in size and structure. Venous: IVC assessment for right atrial pressure unable to be performed due to mechanical ventilation. IAS/Shunts: No atrial level shunt detected by color flow Doppler.  LEFT VENTRICLE PLAX 2D LVIDd:         3.50 cm     Diastology LVIDs:         2.30 cm     LV e' medial:    7.18 cm/s LV PW:         0.90 cm     LV E/e' medial:  10.1 LV IVS:        0.60 cm     LV e' lateral:   6.73 cm/s LVOT diam:     1.60 cm     LV E/e' lateral: 10.8 LV SV:         30 LV SV Index:   18 LVOT Area:     2.01 cm  LV Volumes (MOD) LV vol d, MOD A2C: 42.4 ml LV vol d, MOD A4C: 37.2 ml LV vol s, MOD A2C: 18.4 ml LV vol s, MOD A4C: 15.7 ml LV SV MOD A2C:     24.0 ml LV SV MOD A4C:     37.2 ml LV SV MOD BP:      26.3 ml RIGHT VENTRICLE            IVC RV Basal diam:  3.10 cm    IVC diam: 1.70 cm RV Mid diam:    2.60 cm RV S prime:     7.62 cm/s TAPSE (M-mode): 1.4 cm LEFT ATRIUM             Index        RIGHT ATRIUM          Index LA diam:        2.80 cm 1.71 cm/m   RA Area:     8.87 cm LA Vol (A2C):   19.2 ml 11.76 ml/m  RA Volume:   18.80 ml 11.51 ml/m LA Vol (A4C):   55.6 ml 34.04 ml/m LA Biplane Vol:  32.3 ml 19.78 ml/m   AORTIC VALVE                    PULMONIC VALVE AV Area (Vmax):    1.66 cm     PV Vmax:          0.95 m/s AV Area (Vmean):   1.47 cm     PV Peak grad:     3.6 mmHg AV Area (VTI):     1.67 cm     PR End Diast Vel: 4.16 msec AV Vmax:           108.15 cm/s AV Vmean:          77.900 cm/s AV VTI:            0.178 m AV Peak Grad:      4.7 mmHg AV Mean Grad:      3.0 mmHg LVOT Vmax:         89.40 cm/s LVOT Vmean:        56.800 cm/s LVOT VTI:          0.148 m LVOT/AV VTI ratio: 0.83  AORTA Ao Root diam: 2.70 cm Ao Asc diam:  2.40 cm MITRAL VALVE               TRICUSPID VALVE MV Area (PHT): 5.66 cm    TR Peak grad:   29.4 mmHg MV Area VTI:   1.80 cm    TR Vmax:        271.00 cm/s MV Peak grad:  4.0 mmHg MV Mean grad:  1.0 mmHg    SHUNTS MV Vmax:       1.00 m/s    Systemic VTI:  0.15 m MV Vmean:      49.2 cm/s   Systemic Diam: 1.60 cm MV Decel Time: 134 msec MV E velocity: 72.80 cm/s Morene Brownie Electronically signed by Morene Brownie Signature Date/Time: 01/12/2023/8:25:37 AM    Final    DG Abd Portable 1V Result Date: 01/11/2023 CLINICAL DATA:  Check gastric catheter placement EXAM: PORTABLE ABDOMEN - 1 VIEW COMPARISON:  None Available. FINDINGS: Gastric catheter is noted within the stomach. The large and small bowel gas is noted. Contrast from recent contrast procedure is noted. Scattered fecal material is noted within the colon consistent with mild constipation. IMPRESSION: Gastric catheter within the stomach. Mild constipation. Electronically Signed   By: [name] Devonshire M.D.   On: 01/11/2023 17:54   DG Chest Port 1 View Result Date: 01/11/2023 CLINICAL DATA:  Stroke and status post intubation. EXAM: PORTABLE CHEST 1 VIEW COMPARISON:  None Available. FINDINGS: The heart size and mediastinal contours are within normal limits. Endotracheal tube present with the tip approximately 3 cm above the carina. There is no evidence of pulmonary edema, consolidation, pneumothorax or pleural fluid. The visualized skeletal  structures are unremarkable. IMPRESSION: Endotracheal tube tip approximately 3 cm above the carina. No acute findings. Electronically Signed   By: Marcey Moan M.D.   On: 01/11/2023 16:52   CT ANGIO HEAD NECK W WO CM W PERF (CODE STROKE) Result Date: 01/11/2023 CLINICAL DATA:  Neuro deficit, acute, stroke suspected. Right-sided gaze. Left-sided droop and weakness. Abnormal CT. EXAM: CT ANGIOGRAPHY HEAD AND NECK CT PERFUSION BRAIN TECHNIQUE: Multidetector CT imaging of the head and neck was performed using the standard protocol during bolus administration of intravenous contrast. Multiplanar CT image reconstructions and MIPs were obtained to evaluate the vascular anatomy. Carotid stenosis measurements (when applicable) are obtained utilizing NASCET  criteria, using the distal internal carotid diameter as the denominator. Multiphase CT imaging of the brain was performed following IV bolus contrast injection. Subsequent parametric perfusion maps were calculated using RAPID software. RADIATION DOSE REDUCTION: This exam was performed according to the departmental dose-optimization program which includes automated exposure control, adjustment of the mA and/or kV according to patient size and/or use of iterative reconstruction technique. CONTRAST:  OMNIPAQUE  IOHEXOL  350 MG/ML SOLN COMPARISON:  CT head without contrast 01/11/2023 FINDINGS: Aortic arch: Atherosclerotic calcifications are present at the aortic arch. Calcifications are present the origin left common carotid artery and the subclavian artery without focal stenosis. Left vertebral artery originates directly from the arch. No aneurysm or stenosis is present. No dissection is present. Right carotid system: The right common carotid artery is within normal limits. Atherosclerotic changes are present bifurcation with calcified and noncalcified plaque. No focal stenosis is present. The cervical right ICA is within normal limits. Left carotid system: Minimal  mural calcifications are present in the left common carotid artery without significant stenosis. Minimal calcifications are present at the proximal left ICA without significant stenosis. The cervical left ICA is otherwise normal. Vertebral arteries: Right vertebral artery is dominant. It originates from the is clear right subclavian artery without significant stenosis. The left vertebral artery is hypoplastic, originating from the aortic arch without focal stenosis. No significant stenosis is present in either vertebral artery in the neck. Skeleton: Multilevel degenerative changes are present in the cervical spine. Slight anterolisthesis is present at C3-4. No focal osseous lesions are present. Other neck: Soft tissues the neck are otherwise unremarkable. Salivary glands are within normal limits. Thyroid is normal. No significant adenopathy is present. No focal mucosal or submucosal lesions are present. Upper chest: Scarring is present the lung apices bilaterally. No edema or effusion is present. Review of the MIP images confirms the above findings CTA HEAD FINDINGS Anterior circulation: Internal carotid arteries scratched at minimal calcifications are present within the cavernous internal carotid arteries bilaterally. No focal stenosis is present through the ICA termini. The A1 and M1 segments are scratched at the A1 segments are normal bilaterally. Left M1 segment and bifurcation is normal. Focal occlusion is present in the right M1 segment with relatively poor collateralization to the right MCA territory. The left MCA and bilateral ACA branch vessels are within normal limits. No aneurysm is present. Posterior circulation: PICA origins are visualized and normal bilaterally. Hypoplastic distal V4 segments are present. The basilar artery is small, terminating at the superior cerebellar arteries. The posterior cerebral arteries are of fetal type bilaterally. PCA branch vessels are within normal limits bilaterally. No  aneurysm is present. Venous sinuses: The dural sinuses are patent. The straight sinus and deep cerebral veins are intact. Cortical veins are within normal limits. No significant vascular malformation is evident. Anatomic variants: Fetal type posterior cerebral arteries bilaterally. Review of the MIP images confirms the above findings CT Brain Perfusion Findings: ASPECTS: 9/10 CBF (<30%) Volume: 8mL Perfusion (Tmax>6.0s) volume: Mismatch Volume: Infarction Location:Right lentiform nucleus. IMPRESSION: 1. Focal occlusion of the right M1 segment with relatively poor collateralization to the right MCA territory. 2. 8 mL core infarct of the right lentiform nucleus with 110 mL of ischemic penumbra. 3. Atherosclerotic changes at the right carotid bifurcation and proximal left ICA without significant stenosis. 4. 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 The right vertebral artery is dominant. 5. Multilevel degenerative changes in the cervical spine. 6.  Aortic Atherosclerosis (ICD10-I70.0). Electronically Signed  By: Lonni Necessary M.D.   On: 01/11/2023 13:34   CT HEAD CODE STROKE WO CONTRAST Result Date: 01/11/2023 CLINICAL DATA:  Code stroke.  Left-sided weakness EXAM: CT HEAD WITHOUT CONTRAST TECHNIQUE: Contiguous axial images were obtained from the base of the skull through the vertex without intravenous contrast. RADIATION DOSE REDUCTION: This exam was performed according to the departmental dose-optimization program which includes automated exposure control, adjustment of the mA and/or kV according to patient size and/or use of iterative reconstruction technique. COMPARISON:  None Available. FINDINGS: Brain: There is a background of severe chronic microvascular ischemic change with multiple likely chronic underlying infarcts. There may be an acute infarcts involving the right lentiform nucleus (series 2, image 24). No hemorrhage. No hydrocephalus. No extra-axial  fluid collection. No mass effect. No mass lesion. Vascular: Hyperdense right MCA. Skull: Normal. Negative for fracture or focal lesion. Sinuses/Orbits: No middle ear or mastoid effusion. Paranasal sinuses are clear. Bilateral lens replacement. Orbits are otherwise unremarkable. Other: None. ASPECTS ([place] Stroke Program Early CT Score): 9 IMPRESSION: 1. Possible acute infarct involving the right lentiform nucleus. No hemorrhage. ASPECTS 9 2. Hyperdense right MCA. 3. Background of severe chronic microvascular ischemic change with multiple underlying chronic infarcts. Findings were paged to Dr. Arora on 01/11/2023 at 12:55 p.m. Electronically Signed   By: Lyndall Gore M.D.   On: 01/11/2023 12:57    Vitals:   01/12/23 0515 01/12/23 0530 01/12/23 0545 01/12/23 0600  BP:    (!) 118/58  Pulse: 87 90 93 95  Resp: 18 18 17 18   Temp: (!) 97.5 F (36.4 C) (!) 97.5 F (36.4 C) (!) 97.3 F (36.3 C) 97.7 F (36.5 C)  SpO2: 99% 99% 99% 100%  Weight:      Height:         PHYSICAL EXAM General: Chronically ill elderly Caucasian female Psych: Minimally responsive CV: Rapid A-fib on the monitor Respiratory: Mechanically ventilated, on pressure support GI: Abdomen soft and nontender, cortrak in place   NEURO:  Mental Status: Eyes remain open with forced eye opening, minimal commands Speech/Language: ET tube in place  Cranial Nerves:  II: PERRL.  III, IV, VI: Left gaze, does not cross midline eyelids elevate symmetrically.  V:  VII:  VIII: Hard of hearing at baseline IX, X: cough and gag intact XI: Head to the left  XII: tongue is midline without fasciculations. Motor: Withdraws to pain in right upper extremity and bilateral lower extremities.  No movement in left upper extremity Tone: is normal and bulk is normal Sensation- WTP Coordination: unable to complete Gait- deferred  Most Recent NIH 27    ASSESSMENT/PLAN Ms. KELSEE PRESLAR is a 89 y.o. female with history of hypertension,  thyroid disease, arthritis, osteoporosis, cataracts who was found around noon this morning by her granddaughter down on the floor, minimally responsive. CAT scan which was negative for bleed CT angio and perfusion that showed a right M1 occlusion.  Patient had thrombectomy and returned to ICU intubated.   Stroke:  Right MCA infarct due to occlusion of right M1 s/p IR with TICI3, etiology:  embolic in the setting of new diagnosed afib   Code Stroke CT head- Possible acute infarct involving the right lentiform nucleus. No hemorrhage. ASPECTS 9.  Hyperdense right MCA CTA head & neck Focal occlusion of the right M1 segment with relatively poor collateralization to the right MCA territory. CT perfusion 8 mL core infarct of the right lentiform nucleus with 110 mL of ischemic penumbra. S/p  IR with TICI2c reperfusion Post IR CT- no evidence of ICH . Hyperattenuation of the RT basal ganglia.  MRI  Acute large right MCA territory infarct with petechial hemorrhage in the right basal ganglia.  MRA Interval restoration of flow through the distal M1 segment of the right MCA with residual areas of stenosis in the right M3 segments. 01/21/23 Repeat Head CT- pending   2D Echo EF 70-75%  LDL 50 HgbA1c 6.1 VTE prophylaxis - Lovenox  No antithrombotic prior to admission, now on aspirin  81 mg daily given petechial hemorrahge Therapy recommendations:  Pending Disposition:  Neuro ICU  Newly diagnosed atrial fibrillation New diagnosis no anticoagulation at baseline EKG confirmed Intermittent RVR, largely controlled Now on ASA, further regimen per GOC discussion  Hypertension Home meds:  Lisinopril, amlodipine, metoprolol   Stable Blood Pressure Goal: SBP less than 160 due to petechial hemorrhage Off Cleviprex   Hyperlipidemia Home meds:  Lipitor 10, resumed in hospital LDL 50, goal < 70 Continue statin at discharge  Acute respiratory insufficiency post procedure Remains intubated PCCM consulted for vent  management  Difficult airway Discussed with granddaughter, she will discuss with family regarding re-intubation if pt not tolerating extubation.   Dysphagia Cortrak placed 1/10 NPO On TF  Hypothyroidism Resumed synthroid   Pressure ulcer, present on admission  Malnutrition - on TF, dietitian on board Leukocytosis WBC 14.4->10.7 WOC consult  MASD close to the anus. Partial slough.  Wound care instructions as below Apply Aquacel (lawson#[id number]) and cover with a foam dressing. Change every 3 days or PRN soiling. After the wound bed comes 100 % red, suspended the Aquacel/foam dressing and apply powder antifungal. Keep the area clean and dry to avoid persistent MASD.  Hospital day # 1  Patient seen and examined by NP/APP with MD. MD to update note as needed.   Jorene Last, DNP, FNP-BC [facility] Pager: [phone]  ATTENDING NOTE: I reviewed above note and agree with the assessment and plan. Pt was seen and examined.   No family at bedside.  Patient still intubated, just off sedation.  Patient eyes closed, not following commands, with forced eye opening, forced right gaze, PERRL, left eye corneal reflex absent, right eye corneal reflex present.  Gag and cough present.  Slight withdrawal to pain on the right upper and lower extremity.  Left UE and LLE flaccid. Sensation, coordination and gait not tested.  MRI showed large right MCA infarct with petechial hemorrhage.  MRI showed right M1 now patent.  Stroke likely due to new diagnosed A-fib.  Currently rate controlled with intermittent RVR.  Patient has difficult airway, before extubation will need further discussion with family regarding reintubation status.  Now aspirin  81 given petechial hemorrhage, repeat CT in a.m.  Continue home statin.  Wound care assistant appreciated.  PT and OT pending.  For detailed assessment and plan, please refer to above/below as I have made changes wherever appropriate.   Ary Cummins, MD  PhD Stroke Neurology 01/12/2023 6:58 PM  This patient is critically ill due to large MCA infarct, status post thrombectomy, newly diagnosed A-fib, respiratory failure and at significant risk of neurological worsening, death form recurrent stroke, hemorrhagic transmission, A-fib RVR, heart failure. This patient's care requires constant monitoring of vital signs, hemodynamics, respiratory and cardiac monitoring, review of multiple databases, neurological assessment, discussion with family, other specialists and medical decision making of high complexity. I spent 40 minutes of neurocritical care time in the care of this patient.    To contact Stroke Continuity provider, please refer  to [email or web address]. After hours, contact General Neurology

## 2023-01-12 NOTE — Progress Notes (Signed)
 eLink Physician-Brief Progress Note Patient Name: Melissa Burton DOB: 24-Feb-1932 MRN: [id number]   Date of Service  01/12/2023  HPI/Events of Note    eICU Interventions     SSI placed BG:140 BMI:21     Massie Maroon 01/12/2023, 8:20 PM

## 2023-01-12 NOTE — Progress Notes (Addendum)
 Referring Physician(s): Advice Worker Physician: Dolphus Carrion  Patient Status:  [facility] - In-pt  Chief Complaint:  Right middle cerebral M1 s/p Status post revascularization of occluded right middle cerebral M1 segment, with 1 pass with a 4 mm x 40 mm Solitaire X retrieval device and contact aspiration, 1 pass with the a 3 mm x 20 mm solitaire x retrieval device and contact aspiration and one pass with direct aspiration using an 062 aspiration catheter achieving a TICI  2C revascularization with Dr. Carrion Dolphus   Subjective:  Unable to assess. Patient intubated at this time  Allergies: Patient has no known allergies.  Medications: Prior to Admission medications   Medication Sig Start Date End Date Taking? Authorizing Provider  amLODipine (NORVASC) 10 MG tablet Take 10 mg by mouth daily.    [provider]  atorvastatin  (LIPITOR) 10 MG tablet Take 1 tablet by mouth daily. 11/02/22   [provider]  levothyroxine  (SYNTHROID , LEVOTHROID) 50 MCG tablet Take 50 mcg by mouth daily before breakfast.    [provider]  lisinopril (PRINIVIL,ZESTRIL) 20 MG tablet Take 20 mg by mouth 2 (two) times daily.    [provider]  metoprolol  succinate (TOPROL -XL) 25 MG 24 hr tablet Take 1 tablet by mouth daily. 11/02/22   [provider]  Vitamin D, Ergocalciferol, (DRISDOL) 1.25 MG (50000 UNIT) CAPS capsule Take by mouth. 11/02/22 02/02/23  [provider]     Vital Signs: BP (!) 118/58   Pulse 95   Temp 97.7 F (36.5 C)   Resp 18   Ht 5' 5 (1.651 m)   Wt 127 lb 6.8 oz (57.8 kg)   SpO2 100%   BMI 21.20 kg/m   Physical Exam Vitals and nursing note reviewed.  Constitutional:      Appearance: She is well-developed. She is ill-appearing.  HENT:     Head: Normocephalic and atraumatic.  Eyes:     Conjunctiva/sclera: Conjunctivae normal.     Comments:  Right gaze present  Cardiovascular:     Rate and  Rhythm: Rhythm irregular.     Comments: right groin access site is soft with no active bleeding and no appreciable pseudoaneurysm. Dressing is C/D/I   A fib on the monitory  Pulmonary:     Comments: itubated Musculoskeletal:        General: Swelling (right foot) present.  Neurological:     Mental Status: She is alert.     Comments: Alert Able to follow commands Unable to assess speech Right sided gaze Can spontaneously move right upper and lower extremities. No spontaneous movement of the left side. Distal pulses (DP's) palpable      Imaging: ECHOCARDIOGRAM COMPLETE Result Date: 01/12/2023    ECHOCARDIOGRAM REPORT   Patient Name:   Melissa Burton Date of Exam: 01/12/2023 Medical Rec #:  [id number]        Height:       65.0 in Accession #:    [id number]       Weight:       127.4 lb Date of Birth:  01/08/1932        BSA:          1.633 m Patient Age:    89 years         BP:           123/70 mmHg Patient Gender: F                HR:  95 bpm. Exam Location:  Inpatient Procedure: 2D Echo, Cardiac Doppler and Color Doppler Indications:    Stroke  History:        Patient has no prior history of Echocardiogram examinations.  Sonographer:    Juanita Shaw Referring Phys: [id number] ASHISH ARORA IMPRESSIONS  1. Hyperdynamic LV. Mild intracavitary gradient of . Left ventricular ejection fraction, by estimation, is 70 to 75%. The left ventricle has hyperdynamic function. The left ventricle has no regional wall motion abnormalities. There is mild concentric left ventricular hypertrophy. Left ventricular diastolic parameters are consistent with Grade I diastolic dysfunction (impaired relaxation).  2. Right ventricular systolic function is mildly reduced. The right ventricular size is normal. There is mildly elevated pulmonary artery systolic pressure.  3. Left atrial size was moderately dilated.  4. The mitral valve is normal in structure. Trivial mitral valve regurgitation. No evidence of mitral  stenosis.  5. Tricuspid valve regurgitation is moderate.  6. The aortic valve is normal in structure. Aortic valve regurgitation is not visualized. No aortic stenosis is present. FINDINGS  Left Ventricle: Hyperdynamic LV. Mild intracavitary gradient of . Left ventricular ejection fraction, by estimation, is 70 to 75%. The left ventricle has hyperdynamic function. The left ventricle has no regional wall motion abnormalities. The left ventricular internal cavity size was small. There is mild concentric left ventricular hypertrophy. Left ventricular diastolic parameters are consistent with Grade I diastolic dysfunction (impaired relaxation). Right Ventricle: The right ventricular size is normal. No increase in right ventricular wall thickness. Right ventricular systolic function is mildly reduced. There is mildly elevated pulmonary artery systolic pressure. The tricuspid regurgitant velocity  is 2.71 m/s, and with an assumed right atrial pressure of 8 mmHg, the estimated right ventricular systolic pressure is 37.4 mmHg. Left Atrium: Left atrial size was moderately dilated. Right Atrium: Right atrial size was normal in size. Pericardium: There is no evidence of pericardial effusion. Mitral Valve: The mitral valve is normal in structure. There is mild thickening of the mitral valve leaflet(s). Trivial mitral valve regurgitation. No evidence of mitral valve stenosis. MV peak gradient, 4.0 mmHg. The mean mitral valve gradient is 1.0 mmHg. Tricuspid Valve: The tricuspid valve is normal in structure. Tricuspid valve regurgitation is moderate . No evidence of tricuspid stenosis. Aortic Valve: The aortic valve is normal in structure. Aortic valve regurgitation is not visualized. No aortic stenosis is present. Aortic valve mean gradient measures 3.0 mmHg. Aortic valve peak gradient measures 4.7 mmHg. Aortic valve area, by VTI measures 1.67 cm. Pulmonic Valve: The pulmonic valve was normal in structure. Pulmonic valve  regurgitation is trivial. No evidence of pulmonic stenosis. Aorta: The aortic root is normal in size and structure. Venous: IVC assessment for right atrial pressure unable to be performed due to mechanical ventilation. IAS/Shunts: No atrial level shunt detected by color flow Doppler.  LEFT VENTRICLE PLAX 2D LVIDd:         3.50 cm     Diastology LVIDs:         2.30 cm     LV e' medial:    7.18 cm/s LV PW:         0.90 cm     LV E/e' medial:  10.1 LV IVS:        0.60 cm     LV e' lateral:   6.73 cm/s LVOT diam:     1.60 cm     LV E/e' lateral: 10.8 LV SV:         30 LV SV  Index:   18 LVOT Area:     2.01 cm  LV Volumes (MOD) LV vol d, MOD A2C: 42.4 ml LV vol d, MOD A4C: 37.2 ml LV vol s, MOD A2C: 18.4 ml LV vol s, MOD A4C: 15.7 ml LV SV MOD A2C:     24.0 ml LV SV MOD A4C:     37.2 ml LV SV MOD BP:      26.3 ml RIGHT VENTRICLE            IVC RV Basal diam:  3.10 cm    IVC diam: 1.70 cm RV Mid diam:    2.60 cm RV S prime:     7.62 cm/s TAPSE (M-mode): 1.4 cm LEFT ATRIUM             Index        RIGHT ATRIUM          Index LA diam:        2.80 cm 1.71 cm/m   RA Area:     8.87 cm LA Vol (A2C):   19.2 ml 11.76 ml/m  RA Volume:   18.80 ml 11.51 ml/m LA Vol (A4C):   55.6 ml 34.04 ml/m LA Biplane Vol: 32.3 ml 19.78 ml/m  AORTIC VALVE                    PULMONIC VALVE AV Area (Vmax):    1.66 cm     PV Vmax:          0.95 m/s AV Area (Vmean):   1.47 cm     PV Peak grad:     3.6 mmHg AV Area (VTI):     1.67 cm     PR End Diast Vel: 4.16 msec AV Vmax:           108.15 cm/s AV Vmean:          77.900 cm/s AV VTI:            0.178 m AV Peak Grad:      4.7 mmHg AV Mean Grad:      3.0 mmHg LVOT Vmax:         89.40 cm/s LVOT Vmean:        56.800 cm/s LVOT VTI:          0.148 m LVOT/AV VTI ratio: 0.83  AORTA Ao Root diam: 2.70 cm Ao Asc diam:  2.40 cm MITRAL VALVE               TRICUSPID VALVE MV Area (PHT): 5.66 cm    TR Peak grad:   29.4 mmHg MV Area VTI:   1.80 cm    TR Vmax:        271.00 cm/s MV Peak grad:  4.0 mmHg MV  Mean grad:  1.0 mmHg    SHUNTS MV Vmax:       1.00 m/s    Systemic VTI:  0.15 m MV Vmean:      49.2 cm/s   Systemic Diam: 1.60 cm MV Decel Time: 134 msec MV E velocity: 72.80 cm/s Morene Brownie Electronically signed by Morene Brownie Signature Date/Time: 01/12/2023/8:25:37 AM    Final    DG Abd Portable 1V Result Date: 01/11/2023 CLINICAL DATA:  Check gastric catheter placement EXAM: PORTABLE ABDOMEN - 1 VIEW COMPARISON:  None Available. FINDINGS: Gastric catheter is noted within the stomach. The large and small bowel gas is noted. Contrast from recent contrast procedure is noted. Scattered fecal material is  noted within the colon consistent with mild constipation. IMPRESSION: Gastric catheter within the stomach. Mild constipation. Electronically Signed   By: [name] Devonshire M.D.   On: 01/11/2023 17:54   DG Chest Port 1 View Result Date: 01/11/2023 CLINICAL DATA:  Stroke and status post intubation. EXAM: PORTABLE CHEST 1 VIEW COMPARISON:  None Available. FINDINGS: The heart size and mediastinal contours are within normal limits. Endotracheal tube present with the tip approximately 3 cm above the carina. There is no evidence of pulmonary edema, consolidation, pneumothorax or pleural fluid. The visualized skeletal structures are unremarkable. IMPRESSION: Endotracheal tube tip approximately 3 cm above the carina. No acute findings. Electronically Signed   By: Marcey Moan M.D.   On: 01/11/2023 16:52   CT ANGIO HEAD NECK W WO CM W PERF (CODE STROKE) Result Date: 01/11/2023 CLINICAL DATA:  Neuro deficit, acute, stroke suspected. Right-sided gaze. Left-sided droop and weakness. Abnormal CT. EXAM: CT ANGIOGRAPHY HEAD AND NECK CT PERFUSION BRAIN TECHNIQUE: Multidetector CT imaging of the head and neck was performed using the standard protocol during bolus administration of intravenous contrast. Multiplanar CT image reconstructions and MIPs were obtained to evaluate the vascular anatomy. Carotid stenosis measurements  (when applicable) are obtained utilizing NASCET criteria, using the distal internal carotid diameter as the denominator. Multiphase CT imaging of the brain was performed following IV bolus contrast injection. Subsequent parametric perfusion maps were calculated using RAPID software. RADIATION DOSE REDUCTION: This exam was performed according to the departmental dose-optimization program which includes automated exposure control, adjustment of the mA and/or kV according to patient size and/or use of iterative reconstruction technique. CONTRAST:  OMNIPAQUE  IOHEXOL  350 MG/ML SOLN COMPARISON:  CT head without contrast 01/11/2023 FINDINGS: Aortic arch: Atherosclerotic calcifications are present at the aortic arch. Calcifications are present the origin left common carotid artery and the subclavian artery without focal stenosis. Left vertebral artery originates directly from the arch. No aneurysm or stenosis is present. No dissection is present. Right carotid system: The right common carotid artery is within normal limits. Atherosclerotic changes are present bifurcation with calcified and noncalcified plaque. No focal stenosis is present. The cervical right ICA is within normal limits. Left carotid system: Minimal mural calcifications are present in the left common carotid artery without significant stenosis. Minimal calcifications are present at the proximal left ICA without significant stenosis. The cervical left ICA is otherwise normal. Vertebral arteries: Right vertebral artery is dominant. It originates from the is clear right subclavian artery without significant stenosis. The left vertebral artery is hypoplastic, originating from the aortic arch without focal stenosis. No significant stenosis is present in either vertebral artery in the neck. Skeleton: Multilevel degenerative changes are present in the cervical spine. Slight anterolisthesis is present at C3-4. No focal osseous lesions are present. Other neck:  Soft tissues the neck are otherwise unremarkable. Salivary glands are within normal limits. Thyroid is normal. No significant adenopathy is present. No focal mucosal or submucosal lesions are present. Upper chest: Scarring is present the lung apices bilaterally. No edema or effusion is present. Review of the MIP images confirms the above findings CTA HEAD FINDINGS Anterior circulation: Internal carotid arteries scratched at minimal calcifications are present within the cavernous internal carotid arteries bilaterally. No focal stenosis is present through the ICA termini. The A1 and M1 segments are scratched at the A1 segments are normal bilaterally. Left M1 segment and bifurcation is normal. Focal occlusion is present in the right M1 segment with relatively poor collateralization to the right MCA territory. The  left MCA and bilateral ACA branch vessels are within normal limits. No aneurysm is present. Posterior circulation: PICA origins are visualized and normal bilaterally. Hypoplastic distal V4 segments are present. The basilar artery is small, terminating at the superior cerebellar arteries. The posterior cerebral arteries are of fetal type bilaterally. PCA branch vessels are within normal limits bilaterally. No aneurysm is present. Venous sinuses: The dural sinuses are patent. The straight sinus and deep cerebral veins are intact. Cortical veins are within normal limits. No significant vascular malformation is evident. Anatomic variants: Fetal type posterior cerebral arteries bilaterally. Review of the MIP images confirms the above findings CT Brain Perfusion Findings: ASPECTS: 9/10 CBF (<30%) Volume: 8mL Perfusion (Tmax>6.0s) volume: Mismatch Volume: Infarction Location:Right lentiform nucleus. IMPRESSION: 1. Focal occlusion of the right M1 segment with relatively poor collateralization to the right MCA territory. 2. 8 mL core infarct of the right lentiform nucleus with 110 mL of ischemic penumbra.  3. Atherosclerotic changes at the right carotid bifurcation and proximal left ICA without significant stenosis. 4. 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 The right vertebral artery is dominant. 5. Multilevel degenerative changes in the cervical spine. 6.  Aortic Atherosclerosis (ICD10-I70.0). Electronically Signed   By: Lonni Necessary M.D.   On: 01/11/2023 13:34   CT HEAD CODE STROKE WO CONTRAST Result Date: 01/11/2023 CLINICAL DATA:  Code stroke.  Left-sided weakness EXAM: CT HEAD WITHOUT CONTRAST TECHNIQUE: Contiguous axial images were obtained from the base of the skull through the vertex without intravenous contrast. RADIATION DOSE REDUCTION: This exam was performed according to the departmental dose-optimization program which includes automated exposure control, adjustment of the mA and/or kV according to patient size and/or use of iterative reconstruction technique. COMPARISON:  None Available. FINDINGS: Brain: There is a background of severe chronic microvascular ischemic change with multiple likely chronic underlying infarcts. There may be an acute infarcts involving the right lentiform nucleus (series 2, image 24). No hemorrhage. No hydrocephalus. No extra-axial fluid collection. No mass effect. No mass lesion. Vascular: Hyperdense right MCA. Skull: Normal. Negative for fracture or focal lesion. Sinuses/Orbits: No middle ear or mastoid effusion. Paranasal sinuses are clear. Bilateral lens replacement. Orbits are otherwise unremarkable. Other: None. ASPECTS ([place] Stroke Program Early CT Score): 9 IMPRESSION: 1. Possible acute infarct involving the right lentiform nucleus. No hemorrhage. ASPECTS 9 2. Hyperdense right MCA. 3. Background of severe chronic microvascular ischemic change with multiple underlying chronic infarcts. Findings were paged to Dr. Arora on 01/11/2023 at 12:55 p.m. Electronically Signed   By: Lyndall Gore M.D.   On: 01/11/2023 12:57     Labs:  CBC: Recent Labs    01/11/23 1248 01/11/23 1249 01/11/23 1621 01/12/23 0059 01/12/23 0530  WBC 14.4*  --   --   --  10.7*  HGB 14.2 14.6 11.2* 10.2* 10.9*  HCT 42.3 43.0 33.0* 30.0* 32.5*  PLT 279  --   --   --  216    COAGS: Recent Labs    01/11/23 1248  INR 1.1  APTT 25    BMP: Recent Labs    01/11/23 1248 01/11/23 1249 01/11/23 1621 01/12/23 0059 01/12/23 0530  NA 133* 135 138 137 136  K 4.2 4.3 2.6* 3.4* 3.5  CL 100 100  --   --  105  CO2 19*  --   --   --  21*  GLUCOSE 138* 144*  --   --  102*  BUN 16 18  --   --  16  CALCIUM  9.2  --   --   --  8.0*  CREATININE 0.69 0.50  --   --  0.75  GFRNONAA >60  --   --   --  >60    LIVER FUNCTION TESTS: Recent Labs    01/11/23 1248  BILITOT 1.1  AST 57*  ALT 28  ALKPHOS 120  PROT 7.0  ALBUMIN  3.9    Assessment and Plan:  89 y.o. female inpatient. History of HTN. HLD, . Presented to the ED at [facility] on 1.9.25 with code stroke with AMS and right sided gaze . CT Angio Head reads Focal occlusion of the right M1 segment with relatively poor collateralization to the right MCA territory. s/p vascularization of occluded right middle cerebral M1 segment, with 1 pass with a 4 mm x 40 mm Solitaire X retrieval device and contact aspiration, 1 pass with the a 3 mm x 20 mm solitaire x retrieval device and contact aspiration and one pass with direct aspiration using an 062 aspiration catheter achieving a TICI  2C revascularization.   Patient seen at bedside with Dr. Thyra Nash. Patient remain intubated. Blood pressure labile. A fib on the monitor. Patient alert able to follow commands. Able to move right side upper and lower extremities. Right side gaze. right groin access site is soft with no active bleeding and no appreciable pseudoaneurysm. Dressing is C/D/I.  Patient to stable from IR perspective s/p  vascularization of occluded right middle cerebral M1 segment, further plans per primary Team/ Stroke Team  please call IR with questions or concerns.    Electronically Signed: Delon JAYSON Beagle, NP 01/12/2023, 8:41 AM   I spent a total of 15 Minutes at the patient's bedside AND on the patient's hospital floor or unit, greater than 50% of which was counseling/coordinating care for vascularization of occluded right middle cerebral M1 segment,

## 2023-01-12 NOTE — Progress Notes (Signed)
  Echocardiogram 2D Echocardiogram has been performed.  Ocie Doyne RDCS 01/12/2023, 8:18 AM

## 2023-01-12 NOTE — Progress Notes (Signed)
 eLink Physician-Brief Progress Note Patient Name: Melissa Burton DOB: 1932-02-04 MRN: [id number]   Date of Service  01/12/2023  HPI/Events of Note  89 year old female with a history of paroxysmal atrial fibrillation and cerebrovascular accident status post right MCA revascularization that is intubated due to unresponsiveness.  Called about hypotension with maps in the 40s.  Prophylaxis only.  eICU Interventions  LR bolus Start norepinephrine  peripherally.     Intervention Category Intermediate Interventions: Hypotension - evaluation and management  Albeiro Trompeter 01/12/2023, 12:18 AM

## 2023-01-12 NOTE — Progress Notes (Signed)
 IR site checked upon return to 4N31 from MRI. Site was saturated in serous fluid. Jorene Last, NP and Ronnald Gave, NP at bedside to assess, Dressing removed, site does not appear to be leaking serous fluid at this time. Gauze and Tegaderm was replaced.   1045-Dressing appears to have Serous fluid drainage. Marcelline Beagle, NP made aware, came to bedside to assess. Not concerned at this time, believes fluid is from third spacing. No changes at this time.

## 2023-01-12 NOTE — Progress Notes (Signed)
 PT Cancellation Note  Patient Details Name: Melissa Burton MRN: [id number] DOB: Dec 08, 1932   Cancelled Treatment:    Reason Eval/Treat Not Completed: Medical issues which prohibited therapy; patient just back from MRI.  RN reports not ready for mobility still intubated, sedated.  Will follow up another day.   Montie Portal 01/12/2023, 10:26 AM Micheline Portal, PT Acute Rehabilitation Services Office:[phone] 01/12/2023

## 2023-01-12 NOTE — Plan of Care (Signed)
 Okay to hold of on MRI due to labile BP -- confirmed with nursing, no evidence of bleeding, having both highs and lows, appreciate CCM eval.   Brooke Dare MD-PhD [facility] [phone]

## 2023-01-12 NOTE — Consult Note (Signed)
 WOC Nurse Consult Note: Reason for Consult: Requested to assess a pressure injury on sacrum Wound type: MASD close to the anus. Partial slough.  Measurement: 5x3.5cm Wound bed: 80% red, 20% yellow. Drainage (amount, consistency, odor) minimal amount, no odor. Periwound: red, part macerated. Dressing procedure/placement/frequency: Apply Aquacel [phone]) and cover with a foam dressing. Change every 3 days or PRN soiling. After the wound bed comes 100 % red, suspended the Aquacel/foam dressing and apply powder antifungal. Keep the area clean and dry to avoid persistent MASD.  WOC team will not plan to follow further.  Please reconsult if further assistance is needed. Thank-you,  Lela Holm BSN, RN, [employer], WOC  (Pager: [phone])

## 2023-01-12 NOTE — Progress Notes (Signed)
 Initial Nutrition Assessment  DOCUMENTATION CODES:   Severe malnutrition in context of acute illness/injury  INTERVENTION:   Initiate tube feeding via Cortrak once placed and confirmed: Pivot 1.5 at 20 ml/hr and increase by 10 ml every 8 hours to goal of 45 ml/h (1080 ml per day)  Provides 1620 kcal, 101 gm protein, 810 ml free water daily  Monitor magnesium , potassium, and phosphorus BID for at least 3 days, MD to replete as needed, as pt is at risk for refeeding syndrome given Severe malnutrition.   Add Thiamine  100 mg daily for 7 days Free water per MD 1 packet Juven BID, each packet provides 95 calories, 2.5 grams of protein (collagen), and 9.8 grams of carbohydrate (3 grams sugar); also contains 7 grams of L-arginine and L-glutamine, 300 mg vitamin C, 15 mg vitamin E, 1.2 mcg vitamin B-12, 9.5 mg zinc, 200 mg calcium , and 1.5 g  Calcium  Beta-hydroxy-Beta-methylbutyrate to support wound healing   NUTRITION DIAGNOSIS:   Severe Malnutrition related to acute illness as evidenced by severe muscle depletion, severe fat depletion.   GOAL:   Patient will meet greater than or equal to 90% of their needs   MONITOR:   TF tolerance, Diet advancement, I & O's, Labs, Vent status  REASON FOR ASSESSMENT:   Consult, Ventilator Enteral/tube feeding initiation and management  ASSESSMENT:  89 y.o female admitted for right MCA stroke after being found down by granddaughter.  Has PMH of arthritis, cataracts, HTN, osteoporosis, thyroid disease.  1/9 - CT angiography head and neck confirmed a right M1 occlusion, thrombectomy, Intubated   Pt just came back from MRI on visit. No family bedside.  Spoke to RN who states they were trying to wean her off the vent but will most likely not come off the vent today. Was not following commands. She also reports if she were to be extubated she most likely would not be able to take much by PO due to her having some dysphagia before being intubated and  having 2 lesions on the roof of her mouth. NP in room and I recommended Cortrak placement, team in agreement. Cotrak placed today. Can start at trickles and continue to goal per team.   Patient is currently intubated on ventilator support, PS/CPAP mode  Temp (24hrs), Avg:96.9 F (36.1 C), Min:90.7 F (32.6 C), Max:99.9 F (37.7 C)  Admit weight: 57.8 kg  Current weight: 57.8 kg   Intake/Output Summary (Last 24 hours) at 01/12/2023 1637 Last data filed at 01/12/2023 0900 Gross per 24 hour  Intake 2379.66 ml  Output 350 ml  Net 2029.66 ml   Net IO Since Admission: 2,529.66 mL [01/12/23 1637]  Average Meal Intake: NPO  Nutritionally Relevant Medications: Scheduled Meds:  aspirin   81 mg Per Tube Daily   atorvastatin   10 mg Per Tube Daily   Chlorhexidine  Gluconate Cloth  6 each Topical Daily   docusate  100 mg Per Tube BID   enoxaparin  (LOVENOX ) injection  40 mg Subcutaneous Q24H   famotidine   20 mg Per Tube Daily   feeding supplement (PIVOT 1.5 CAL)  1,000 mL Per Tube Q24H   nutrition supplement (JUVEN)  1 packet Per Tube BID BM   mouth rinse  15 mL Mouth Rinse Q2H   polyethylene glycol  17 g Per Tube Daily   Continuous Infusions:  sodium chloride  Stopped (01/12/23 1531)   clevidipine  2 mg/hr (01/12/23 0900)   norepinephrine  (LEVOPHED ) Adult infusion Stopped (01/12/23 0045)   propofol  (DIPRIVAN ) infusion Stopped (01/12/23 0828)  Labs Reviewed: Potassium 3.4, Ionized calcium  1.13 CBG ranges from 105-140 mg/dL over the last 24 hours HgbA1c 6.1  NUTRITION - FOCUSED PHYSICAL EXAM:  Flowsheet Row Most Recent Value  Orbital Region Severe depletion  Upper Arm Region Severe depletion  Thoracic and Lumbar Region Severe depletion  Buccal Region Severe depletion  Temple Region Severe depletion  Clavicle Bone Region Severe depletion  Clavicle and Acromion Bone Region Severe depletion  Scapular Bone Region Unable to assess  Dorsal Hand Severe depletion  Patellar Region  Unable to assess  [Fluid]  Anterior Thigh Region Unable to assess  [Fluid]  Posterior Calf Region Unable to assess  [Fluid]  Edema (RD Assessment) Moderate  [Lower extremities edema]  Hair Reviewed  Eyes Unable to assess  Mouth Unable to assess  [Per RN pt has growth on roof of mouth]  Skin Reviewed  Nails Reviewed       Diet Order:   Diet Order             Diet NPO time specified  Diet effective now                   EDUCATION NEEDS:   Not appropriate for education at this time  Skin:  Skin Assessment: Skin Integrity Issues: Skin Integrity Issues:: Unstageable Unstageable: Not staged buttocks pressure injury  Last BM:     Height:   Ht Readings from Last 1 Encounters:  01/11/23 5' 5 (1.651 m)    Weight:   Wt Readings from Last 1 Encounters:  01/11/23 57.8 kg    Ideal Body Weight:     BMI:  Body mass index is 21.2 kg/m.  Estimated Nutritional Needs:   Kcal:  1600-1800 kcal  Protein:  90-110 gm  Fluid:  >1.6L   Olivia Kenning, RD Registered Dietitian  See Amion for more information

## 2023-01-12 NOTE — Progress Notes (Addendum)
 NAME:  Melissa Burton, MRN:  [id number], DOB:  04/25/1932, LOS: 1 ADMISSION DATE:  01/11/2023, CONSULTATION DATE: 01/11/2023 REFERRING MD: Dr. Voncile, CHIEF COMPLAINT: Left-sided weakness  History of Present Illness:  89 year old female with hypertension was brought into the emergency department after he was found down by his granddaughter, code stroke was activated as patient was noted to have left-sided weakness, left facial droop with right gaze deviation CT head was negative, CTA head and neck showed occluded right M1, patient underwent mechanical thrombectomy with TICI 2C resolved, remains intubated, PCCM was consulted for medical management.  Pertinent  Medical History   Past Medical History:  Diagnosis Date   Arthritis    Cataract    Hypertension    Osteoporosis    Thyroid disease      Significant Hospital Events: Including procedures, antibiotic start and stop dates in addition to other pertinent events   R MCA stroke  Interim History / Subjective:  Overnight did not go for MRI 2/2 hemodynamic lability   Getting echo at time of exam this morning   Objective   Blood pressure (!) 118/58, pulse 95, temperature 97.7 F (36.5 C), resp. rate 18, height 5' 5 (1.651 m), weight 57.8 kg, SpO2 100%.    Vent Mode: PSV;CPAP FiO2 (%):  [40 %-60 %] 40 % Set Rate:  [18 bmp] 18 bmp Vt Set:  [450 mL] 450 mL PEEP:  [5 cmH20] 5 cmH20 Pressure Support:  [5 cmH20] 5 cmH20 Plateau Pressure:  [11 cmH20-15 cmH20] 11 cmH20   Intake/Output Summary (Last 24 hours) at 01/12/2023 1028 Last data filed at 01/12/2023 0033 Gross per 24 hour  Intake 3305.41 ml  Output 1500 ml  Net 1805.41 ml   Filed Weights   01/11/23 1200  Weight: 57.8 kg    Examination: General: Critically ill elderly F intubated  HEENT: [place]/AT, eyes anicteric.  ETT and OGT in place Neuro: Sedation recently stopped. Pupils are 2mm. R gaze preference. Does not follow commands  Chest: Mechanically ventilated, even unlabored  on PSV  Heart: irreg rhythm s1s2 cap refill < 3 sec  Abdomen: soft ndnt  Skin: c/d/w Dark eschar appearing lesion of medial gluteus  proximal to labia. Sacral skin injury as well   Resolved Hospital Problem list     Assessment & Plan:   Acute R MCA CVA s/p mechanical thrombectomy  P -MRI brain 1/10 -wean sedation  -SBP goal 120-140 (has required both NE and clevi at different times), expect liberalization to permissive HTN pending repeat imaging  -secondary prevention per stroke team   pAfib, rate controlled   Diastolic HF  Moderate Mitral regurg  Moderate tricuspid regurg  -midly elevated PASP, ? pHTN  P -Start anticoagulation after MRI is completed -optimize lytes   Acute respiratory insufficiency postprocedure Difficult airway  P -WUA/SBT after MRI   Prediabetes  -when appropriate lifestyle mod counseling   Hx HTN Hx mixed HLD  -restart home statin  -holding home BP meds w labile BP acutely -looks like pt has declined outpt modifications to her home antihypertensives   Hypothyroidism -can restart home synthroid  01-30-23 (per tube vs PO pending status) Verify home dose -- med rec w 75 mcg, last PCP note looks like change to   Pressure injury POA -WOC consult appreciated   Best Practice (right click and Reselect all SmartList Selections daily)   Diet/type: NPO DVT prophylaxis SCD Pressure ulcer(s): present on admission see media from 1/9  GI prophylaxis: PPI Lines: N/A Foley:  Yes, and it is still needed Code Status:  full code Last date of multidisciplinary goals of care discussion [Per primary team]  Labs   CBC: Recent Labs  Lab 01/11/23 1248 01/11/23 1249 01/11/23 1621 01/12/23 0059 01/12/23 0530  WBC 14.4*  --   --   --  10.7*  NEUTROABS 12.6*  --   --   --  9.0*  HGB 14.2 14.6 11.2* 10.2* 10.9*  HCT 42.3 43.0 33.0* 30.0* 32.5*  MCV 93.2  --   --   --  92.6  PLT 279  --   --   --  216    Basic Metabolic Panel: Recent Labs  Lab  01/11/23 1248 01/11/23 1249 01/11/23 1621 01/12/23 0059 01/12/23 0530  NA 133* 135 138 137 136  K 4.2 4.3 2.6* 3.4* 3.5  CL 100 100  --   --  105  CO2 19*  --   --   --  21*  GLUCOSE 138* 144*  --   --  102*  BUN 16 18  --   --  16  CREATININE 0.69 0.50  --   --  0.75  CALCIUM  9.2  --   --   --  8.0*   GFR: Estimated Creatinine Clearance: 43.7 mL/min (by C-G formula based on SCr of 0.75 mg/dL). Recent Labs  Lab 01/11/23 1248 01/12/23 0530  WBC 14.4* 10.7*    Liver Function Tests: Recent Labs  Lab 01/11/23 1248  AST 57*  ALT 28  ALKPHOS 120  BILITOT 1.1  PROT 7.0  ALBUMIN  3.9   No results for input(s): LIPASE, AMYLASE in the last 168 hours. No results for input(s): AMMONIA in the last 168 hours.  ABG    Component Value Date/Time   PHART 7.440 01/12/2023 0059   PCO2ART 30.7 (L) 01/12/2023 0059   PO2ART 160 (H) 01/12/2023 0059   HCO3 21.2 01/12/2023 0059   TCO2 22 01/12/2023 0059   ACIDBASEDEF 3.0 (H) 01/12/2023 0059   O2SAT 100 01/12/2023 0059     Coagulation Profile: Recent Labs  Lab 01/11/23 1248  INR 1.1    Cardiac Enzymes: No results for input(s): CKTOTAL, CKMB, CKMBINDEX, TROPONINI in the last 168 hours.  HbA1C: Hgb A1c MFr Bld  Date/Time Value Ref Range Status  01/11/2023 12:48 PM 6.1 (H) 4.8 - 5.6 % Final    Comment:    (NOTE) Pre diabetes:          5.7%-6.4%  Diabetes:              >6.4%  Glycemic control for   <7.0% adults with diabetes     CBG: Recent Labs  Lab 01/11/23 1244 01/11/23 2355  GLUCAP 140* 128*   CRITICAL CARE Performed by: Ronnald FORBES Gave   Total critical care time: 38 minutes  Critical care time was exclusive of separately billable procedures and treating other patients.  Critical care was necessary to treat or prevent imminent or life-threatening deterioration.  Critical care was time spent personally by me on the following activities: development of treatment plan with patient and/or  surrogate as well as nursing, discussions with consultants, evaluation of patient's response to treatment, examination of patient, obtaining history from patient or surrogate, ordering and performing treatments and interventions, ordering and review of laboratory studies, ordering and review of radiographic studies, pulse oximetry and re-evaluation of patient's condition.  Ronnald Gave MSN, AGACNP-BC [facility] Amion for pager  01/12/2023, 10:28 AM

## 2023-01-13 ENCOUNTER — Inpatient Hospital Stay (HOSPITAL_COMMUNITY): Payer: Medicare Other

## 2023-01-13 DIAGNOSIS — I63511 Cerebral infarction due to unspecified occlusion or stenosis of right middle cerebral artery: Secondary | ICD-10-CM | POA: Diagnosis not present

## 2023-01-13 DIAGNOSIS — Z7189 Other specified counseling: Secondary | ICD-10-CM

## 2023-01-13 DIAGNOSIS — Z66 Do not resuscitate: Secondary | ICD-10-CM

## 2023-01-13 LAB — CBC
HCT: 29.9 % — ABNORMAL LOW (ref 36.0–46.0)
Hemoglobin: 10 g/dL — ABNORMAL LOW (ref 12.0–15.0)
MCH: 31.4 pg (ref 26.0–34.0)
MCHC: 33.4 g/dL (ref 30.0–36.0)
MCV: 94 fL (ref 80.0–100.0)
Platelets: 187 10*3/uL (ref 150–400)
RBC: 3.18 MIL/uL — ABNORMAL LOW (ref 3.87–5.11)
RDW: 12.9 % (ref 11.5–15.5)
WBC: 7.9 10*3/uL (ref 4.0–10.5)
nRBC: 0 % (ref 0.0–0.2)

## 2023-01-13 LAB — BASIC METABOLIC PANEL
Anion gap: 5 (ref 5–15)
BUN: 34 mg/dL — ABNORMAL HIGH (ref 8–23)
CO2: 23 mmol/L (ref 22–32)
Calcium: 8.1 mg/dL — ABNORMAL LOW (ref 8.9–10.3)
Chloride: 107 mmol/L (ref 98–111)
Creatinine, Ser: 0.61 mg/dL (ref 0.44–1.00)
GFR, Estimated: >60 mL/min (ref >60)
Glucose, Bld: 139 mg/dL — ABNORMAL HIGH (ref 70–99)
Potassium: 4.1 mmol/L (ref 3.5–5.1)
Sodium: 135 mmol/L (ref 135–145)

## 2023-01-13 LAB — GLUCOSE, CAPILLARY
Glucose-Capillary: 113 mg/dL — ABNORMAL HIGH (ref 70–99)
Glucose-Capillary: 139 mg/dL — ABNORMAL HIGH (ref 70–99)
Glucose-Capillary: 145 mg/dL — ABNORMAL HIGH (ref 70–99)
Glucose-Capillary: 110 mg/dL — ABNORMAL HIGH (ref 70–99)

## 2023-01-13 LAB — MAGNESIUM: Magnesium: 2.1 mg/dL (ref 1.7–2.4)

## 2023-01-13 LAB — PHOSPHORUS: Phosphorus: 1.7 mg/dL — ABNORMAL LOW (ref 2.5–4.6)

## 2023-01-13 MED ORDER — SODIUM CHLORIDE 0.9 % IV SOLN: INTRAVENOUS | Status: DC

## 2023-01-13 MED ORDER — MORPHINE BOLUS VIA INFUSION
5.0000 mg | INTRAVENOUS | Status: DC | PRN
Start: 2023-01-13 — End: 2023-01-14

## 2023-01-13 MED ORDER — GLYCOPYRROLATE 1 MG PO TABS: 1.0000 mg | ORAL_TABLET | ORAL | Status: DC | PRN

## 2023-01-13 MED ORDER — ACETAMINOPHEN 650 MG RE SUPP: 650.0000 mg | Freq: Four times a day (QID) | RECTAL | Status: DC | PRN

## 2023-01-13 MED ORDER — GLYCOPYRROLATE 0.2 MG/ML IJ SOLN
0.2000 mg | INTRAMUSCULAR | Status: DC | PRN
  Administered 2023-01-13: 0.2 mg via INTRAVENOUS
  Filled 2023-01-13: qty 1

## 2023-01-13 MED ORDER — LORAZEPAM 2 MG/ML IJ SOLN
2.0000 mg | INTRAMUSCULAR | Status: DC | PRN
  Administered 2023-01-13: 2 mg via INTRAVENOUS
  Filled 2023-01-13: qty 1

## 2023-01-13 MED ORDER — ACETAMINOPHEN 325 MG PO TABS: 650.0000 mg | ORAL_TABLET | Freq: Four times a day (QID) | ORAL | Status: DC | PRN

## 2023-01-13 MED ORDER — GLYCOPYRROLATE 0.2 MG/ML IJ SOLN: 0.2000 mg | INTRAMUSCULAR | Status: DC | PRN

## 2023-01-13 MED ORDER — POLYVINYL ALCOHOL 1.4 % OP SOLN: 1.0000 [drp] | Freq: Four times a day (QID) | OPHTHALMIC | Status: DC | PRN

## 2023-01-13 MED ORDER — LEVOTHYROXINE SODIUM 50 MCG PO TABS
50.0000 ug | ORAL_TABLET | Freq: Every day | ORAL | Status: DC
  Administered 2023-01-13: 50 ug
  Filled 2023-01-13: qty 1

## 2023-01-13 MED ORDER — MORPHINE 100MG IN NS 100ML (1MG/ML) PREMIX INFUSION
0.0000 mg/h | INTRAVENOUS | Status: DC
  Administered 2023-01-13: 5 mg/h via INTRAVENOUS
  Filled 2023-01-13: qty 100

## 2023-01-13 MED ORDER — SODIUM PHOSPHATES 45 MMOLE/15ML IV SOLN
30.0000 mmol | Freq: Once | INTRAVENOUS | Status: DC
  Administered 2023-01-13: 30 mmol via INTRAVENOUS
  Filled 2023-01-13: qty 10

## 2023-01-13 MED ORDER — MAGNESIUM SULFATE 2 GM/50ML IV SOLN
2.0000 g | Freq: Once | INTRAVENOUS | Status: AC
  Administered 2023-01-13: 2 g via INTRAVENOUS
  Filled 2023-01-13: qty 50

## 2023-02-03 NOTE — Progress Notes (Signed)
 Pt passed on at 2107, pronounced by Quintin, RN and Schuyler, RN,  Dr. Jerrie notified via page, granddaughter [name], notified via a call, she stated that she will update other family members on the funeral arrangements and she does not plan on coming to see the deceased.

## 2023-02-03 NOTE — Progress Notes (Addendum)
 STROKE TEAM PROGRESS NOTE   SIGNIFICANT HOSPITAL EVENTS 1/9- IR for mechanical thrombectomy  INTERIM HISTORY/SUBJECTIVE Remains intubated. Conversation with Melissa Burton (Granddaughter) this morning with decision for continued treatment with one way extubation this afternoon when they arrive. She will be a DNR/DNI as well. CCM present for conversation.   She is awake but does not follow commands.  She still has persistent gaze deviation and left hemiparesis.  Will withdraw her left leg but not left upper extremity OBJECTIVE  CBC    Component Value Date/Time   WBC 10.7 (H) 01/12/2023 0530   RBC 3.51 (L) 01/12/2023 0530   HGB 10.9 (L) 01/12/2023 0530   HCT 32.5 (L) 01/12/2023 0530   PLT 216 01/12/2023 0530   MCV 92.6 01/12/2023 0530   MCH 31.1 01/12/2023 0530   MCHC 33.5 01/12/2023 0530   RDW 12.8 01/12/2023 0530   LYMPHSABS 0.8 01/12/2023 0530   MONOABS 0.9 01/12/2023 0530   EOSABS 0.0 01/12/2023 0530   BASOSABS 0.0 01/12/2023 0530    BMET    Component Value Date/Time   NA 136 01/12/2023 0530   K 3.5 01/12/2023 0530   CL 105 01/12/2023 0530   CO2 21 (L) 01/12/2023 0530   GLUCOSE 102 (H) 01/12/2023 0530   BUN 16 01/12/2023 0530   CREATININE 0.75 01/12/2023 0530   CALCIUM  8.0 (L) 01/12/2023 0530   GFRNONAA >60 01/12/2023 0530    IMAGING past 24 hours CT HEAD WO CONTRAST ( ) Result Date: Jan 14, 2023 CLINICAL DATA:  89 year old female code stroke presentation on 01/11/2023 with right M1 occlusion. Status post endovascular reperfusion. EXAM: CT HEAD WITHOUT CONTRAST TECHNIQUE: Contiguous axial images were obtained from the base of the skull through the vertex without intravenous contrast. RADIATION DOSE REDUCTION: This exam was performed according to the departmental dose-optimization program which includes automated exposure control, adjustment of the mA and/or kV according to patient size and/or use of iterative reconstruction technique. COMPARISON:  Brain MRI 01/12/2023,  presentation head CT 01/11/2023. FINDINGS: Brain: Patchy and confluent cytotoxic edema corresponding to the areas of restricted diffusion on MRI yesterday in the right MCA territory. Right lentiform petechial hemorrhage appears stable (series 7, image 18) Heidelberg classification 1b: HI2, confluent petechiae, no mass effect. No malignant hemorrhagic transformation. Stable effaced right lateral ventricle. No midline shift. Stable gray-white differentiation elsewhere include chronic right PICA cerebellar infarct, Patchy and confluent bilateral cerebral white matter disease. No new areas of cerebral edema identified. Vascular: Calcified atherosclerosis at the skull base. Regressed hyperdensity of the right MCA since presentation. Skull: Intact.  No acute osseous abnormality identified. Sinuses/Orbits: Visualized paranasal sinuses and mastoids are stable and well aerated. Other: Posterior convexity broad-based scalp hematoma series 4, image 21. Similar right superior convexity scalp edema or hematoma image 17. Orbits soft tissues appear stable, negative. Right nasoenteric tube now in place. IMPRESSION: 1. Stable Right MCA infarct with basal ganglia petechial hemorrhage. No malignant hemorrhagic transformation or progressive mass effect. 2. No new intracranial abnormality. Underlying chronic ischemic disease. 3. Scalp hematomas.  Right nasoenteric tube. Electronically Signed   By: VEAR Hurst M.D.   On: 2023-01-14 04:12   VAS US  LOWER EXTREMITY VENOUS (DVT) Result Date: 01/12/2023  Lower Venous DVT Study Patient Name:  Melissa Burton  Date of Exam:   01/12/2023 Medical Rec #: [id number]         Accession #:    [id number] Date of Birth: 01-06-1932         Patient Gender: F Patient Age:   26 years  Exam Location:  [facility] Procedure:      VAS US  LOWER EXTREMITY VENOUS (DVT) Referring Phys: ARY XU --------------------------------------------------------------------------------  Indications: Stroke.   Limitations: Bandages and patient unable to cooperate due to condition. Comparison Study: No previous Performing Technologist: Jody Hill RVT, RDMS  Examination Guidelines: A complete evaluation includes B-mode imaging, spectral Doppler, color Doppler, and power Doppler as needed of all accessible portions of each vessel. Bilateral testing is considered an integral part of a complete examination. Limited examinations for reoccurring indications may be performed as noted. The reflux portion of the exam is performed with the patient in reverse Trendelenburg.  +---------+---------------+---------+-----------+----------+--------------+ RIGHT    CompressibilityPhasicitySpontaneityPropertiesThrombus Aging +---------+---------------+---------+-----------+----------+--------------+ CFV                                                   Not visualized +---------+---------------+---------+-----------+----------+--------------+ SFJ                                                   Not visualized +---------+---------------+---------+-----------+----------+--------------+ FV Prox  Full           Yes      Yes                                 +---------+---------------+---------+-----------+----------+--------------+ FV Mid   Full           Yes      Yes                                 +---------+---------------+---------+-----------+----------+--------------+ FV DistalFull           Yes      Yes                                 +---------+---------------+---------+-----------+----------+--------------+ PFV      Full                                                        +---------+---------------+---------+-----------+----------+--------------+ POP      Full           Yes      Yes                                 +---------+---------------+---------+-----------+----------+--------------+ PTV      Full                                                         +---------+---------------+---------+-----------+----------+--------------+ PERO     Full                                                        +---------+---------------+---------+-----------+----------+--------------+  Unable to visualized CFV & SFJ due to procedure bandage.  +---------+---------------+---------+-----------+----------+--------------+ LEFT     CompressibilityPhasicitySpontaneityPropertiesThrombus Aging +---------+---------------+---------+-----------+----------+--------------+ CFV      Full           Yes      Yes                                 +---------+---------------+---------+-----------+----------+--------------+ SFJ      Full                                                        +---------+---------------+---------+-----------+----------+--------------+ FV Prox  Full           Yes      Yes                                 +---------+---------------+---------+-----------+----------+--------------+ FV Mid   Full           Yes      Yes                                 +---------+---------------+---------+-----------+----------+--------------+ FV DistalFull           Yes      Yes                                 +---------+---------------+---------+-----------+----------+--------------+ PFV      Full                                                        +---------+---------------+---------+-----------+----------+--------------+ POP      Full           Yes      Yes                                 +---------+---------------+---------+-----------+----------+--------------+ PTV      Full                                                        +---------+---------------+---------+-----------+----------+--------------+ PERO     Full                                                        +---------+---------------+---------+-----------+----------+--------------+     Summary: BILATERAL: -No evidence of popliteal cyst,  bilaterally. RIGHT: - There is no evidence of deep vein thrombosis in the lower extremity. However, portions of this examination were limited- see technologist comments above.  LEFT: - There is no evidence of deep vein thrombosis in the lower extremity.  *  See table(s) above for measurements and observations. Electronically signed by Penne Colorado MD on 01/12/2023 at 7:20:35 PM.    Final    DG Abd Portable 1V Result Date: 01/12/2023 CLINICAL DATA:  Feeding tube placement. EXAM: PORTABLE ABDOMEN - 1 VIEW COMPARISON:  January 11, 2023. FINDINGS: Distal tip of feeding tube is seen in expected position of distal stomach. IMPRESSION: Distal tip of feeding tube seen in expected position of distal stomach. Electronically Signed   By: Lynwood Landy Raddle M.D.   On: 01/12/2023 14:11   MR BRAIN WO CONTRAST Result Date: 01/12/2023 CLINICAL DATA:  Stroke, follow up EXAM: MRI HEAD WITHOUT CONTRAST MRA HEAD WITHOUT CONTRAST TECHNIQUE: Multiplanar, multi-echo pulse sequences of the brain and surrounding structures were acquired without intravenous contrast. Angiographic images of the Circle of Willis were acquired using MRA technique without intravenous contrast. COMPARISON:  CT Head / CTA head neck 01/11/23 FINDINGS: MRI HEAD FINDINGS Brain: Acute right MCA territory infarct involving the right basal ganglia, right frontal lobe, right parietal lobe, the insular cortex, in the anterior right temporal lobe. There is petechial hemorrhage in the right basal ganglia. No mass effect. No mass lesion. No hydrocephalus. No extra-axial fluid collection. There is a chronic right cerebellar infarct. There is a background of moderate chronic microvascular ischemic change. Vascular: See below Skull and upper cervical spine: Normal marrow signal. Sinuses/Orbits: No middle ear or mastoid effusion. Paranasal sinuses are clear. Bilateral lens replacement. Orbits are otherwise unremarkable. Other: None. MRA HEAD FINDINGS Anterior circulation: There is  interval restoration of flow through the distal M1 segment of the right MCA. There is now opacification of the right M2 and M3 branches with likely residual areas of stenosis in the right M3 segment. Bilateral ACA and left MCA territories are normal in appearance. Posterior circulation: No aneurysm. No occlusion. No vascular malformation. Anatomic variants: None IMPRESSION: 1. Acute large right MCA territory infarct with petechial hemorrhage in the right basal ganglia. 2. Interval restoration of flow through the distal M1 segment of the right MCA with residual areas of stenosis in the right M3 segments. These results will be called to the ordering clinician or representative by the Radiologist Assistant, and communication documented in the PACS or [employer]. Electronically Signed   By: Lyndall Gore M.D.   On: 01/12/2023 11:09   MR ANGIO HEAD WO CONTRAST Result Date: 01/12/2023 CLINICAL DATA:  Stroke, follow up EXAM: MRI HEAD WITHOUT CONTRAST MRA HEAD WITHOUT CONTRAST TECHNIQUE: Multiplanar, multi-echo pulse sequences of the brain and surrounding structures were acquired without intravenous contrast. Angiographic images of the Circle of Willis were acquired using MRA technique without intravenous contrast. COMPARISON:  CT Head / CTA head neck 01/11/23 FINDINGS: MRI HEAD FINDINGS Brain: Acute right MCA territory infarct involving the right basal ganglia, right frontal lobe, right parietal lobe, the insular cortex, in the anterior right temporal lobe. There is petechial hemorrhage in the right basal ganglia. No mass effect. No mass lesion. No hydrocephalus. No extra-axial fluid collection. There is a chronic right cerebellar infarct. There is a background of moderate chronic microvascular ischemic change. Vascular: See below Skull and upper cervical spine: Normal marrow signal. Sinuses/Orbits: No middle ear or mastoid effusion. Paranasal sinuses are clear. Bilateral lens replacement. Orbits are otherwise  unremarkable. Other: None. MRA HEAD FINDINGS Anterior circulation: There is interval restoration of flow through the distal M1 segment of the right MCA. There is now opacification of the right M2 and M3 branches with likely residual areas of stenosis in  the right M3 segment. Bilateral ACA and left MCA territories are normal in appearance. Posterior circulation: No aneurysm. No occlusion. No vascular malformation. Anatomic variants: None IMPRESSION: 1. Acute large right MCA territory infarct with petechial hemorrhage in the right basal ganglia. 2. Interval restoration of flow through the distal M1 segment of the right MCA with residual areas of stenosis in the right M3 segments. These results will be called to the ordering clinician or representative by the Radiologist Assistant, and communication documented in the PACS or [employer]. Electronically Signed   By: Lyndall Gore M.D.   On: 01/12/2023 11:09    Vitals:   02-12-23 0600 February 12, 2023 0700 02/12/2023 0800 02-12-23 0817  BP: 119/74 (!) 115/56 (!) 111/53   Pulse: (!) 109 (!) 105 94 (!) 115  Resp: 20 (!) 21 20 (!) 23  Temp:   98.6 F (37 C)   TempSrc:   Axillary   SpO2: 97% 93% 96% 97%  Weight:      Height:         PHYSICAL EXAM General: Chronically ill elderly Caucasian female.  Intubated Psych: Minimally responsive CV: Rapid A-fib on the monitor Respiratory: Mechanically ventilated, on pressure support GI: Abdomen soft and nontender, cortrak in place   NEURO:  Mental Status: Eyes remain open with forced eye opening, minimal intermittent commands Speech/Language: ET tube in place  Cranial Nerves:  II: PERRL.  III, IV, VI: Right gaze, does not cross midline eyelids elevate symmetrically.  V:  VII:  VIII: Hard of hearing at baseline IX, X: cough and gag intact XI: Head to the left  XII: tongue is midline without fasciculations. Motor: Withdraws to pain in right upper extremity and bilateral lower extremities.  No movement in  left upper extremity Tone: is normal and bulk is normal Sensation- WTP Coordination: unable to complete Gait- deferred  Most Recent NIH 27    ASSESSMENT/PLAN Ms. DACIA CAPERS is a 89 y.o. female with history of hypertension, thyroid disease, arthritis, osteoporosis, cataracts who was found by her granddaughter down on the floor, minimally responsive. CAT scan which was negative for bleed CT angio and perfusion that showed a right M1 occlusion.  Patient had thrombectomy and returned to ICU intubated.   Stroke:  Right MCA infarct due to occlusion of right M1 s/p IR with TICI3, etiology:  embolic in the setting of new diagnosed afib   Code Stroke CT head- Possible acute infarct involving the right lentiform nucleus. No hemorrhage. ASPECTS 9.  Hyperdense right MCA CTA head & neck Focal occlusion of the right M1 segment with relatively poor collateralization to the right MCA territory. CT perfusion 8 mL core infarct of the right lentiform nucleus with 110 mL of ischemic penumbra. S/p IR with TICI2c reperfusion Post IR CT- no evidence of ICH . Hyperattenuation of the RT basal ganglia.  MRI  Acute large right MCA territory infarct with petechial hemorrhage in the right basal ganglia.  MRA Interval restoration of flow through the distal M1 segment of the right MCA with residual areas of stenosis in the right M3 segments. 12-Feb-2023 Repeat Head CT- Stable Right MCA infarct with basal ganglia petechial hemorrhage. No malignant hemorrhagic transformation or progressive mass effect. No new intracranial abnormality. Underlying chronic ischemic disease. Scalp hematomas.  2D Echo EF 70-75%  LDL 50 HgbA1c 6.1 VTE prophylaxis - Lovenox  No antithrombotic prior to admission, now on aspirin  81 mg daily given petechial hemorrahge Therapy recommendations:  Pending Disposition:  Neuro ICU  Newly diagnosed atrial  fibrillation New diagnosis no anticoagulation at baseline EKG confirmed Intermittent RVR, largely  controlled Now on ASA, further regimen per GOC discussion  Hypertension Home meds:  Lisinopril, amlodipine, metoprolol   Stable Blood Pressure Goal: SBP less than 160 due to petechial hemorrhage Off Cleviprex , prn labetalol  available   Hyperlipidemia Home meds:  Lipitor 10, resumed in hospital LDL 50, goal < 70 Continue statin at discharge  Acute respiratory insufficiency post procedure Remains intubated PCCM consulted for vent management  Difficult airway Plan for one way extubation 02/07/2023  DNR/DNI code status updated  Dysphagia Cortrak placed 1/10 NPO On TF  Hypothyroidism Resumed synthroid   Pressure ulcer, present on admission  Malnutrition - on TF, dietitian on board Leukocytosis WBC 14.4->10.7 WOC consult  MASD close to the anus. Partial slough.  Wound care instructions as below Apply Aquacel (lawson#[id number]) and cover with a foam dressing. Change every 3 days or PRN soiling. After the wound bed comes 100 % red, suspended the Aquacel/foam dressing and apply powder antifungal. Keep the area clean and dry to avoid persistent MASD.  Hospital day # 2  Patient seen and examined by NP/APP with MD. MD to update note as needed.   Jorene Last, DNP, FNP-BC [facility] Pager: [phone]  STROKE MD :  I have personally obtained history,examined this patient, reviewed notes, independently viewed imaging studies, participated in medical decision making and plan of care.ROS completed by me personally and pertinent positives fully documented  I have made any additions or clarifications directly to the above note. Agree with note above.  Patient remains intubated for respiratory failure but is awake and arousable today though she is not following commands consistently.  She has persistent right gaze deviation and left hemiparesis.  Long discussion over the phone with the patient's daughter by stroke nurse practitioner and she agrees with 1 weeks duration and no  reintubation but continue appropriate medical care and DNR.  Wean off ventilatory support as per critical care team.  Continue aspirin  for stroke prevention and aggressive risk factor modification.  Discussed with critical care team.This patient is critically ill and at significant risk of neurological worsening, death and care requires constant monitoring of vital signs, hemodynamics,respiratory and cardiac monitoring, extensive review of multiple databases, frequent neurological assessment, discussion with family, other specialists and medical decision making of high complexity.I have made any additions or clarifications directly to the above note.This critical care time does not reflect procedure time, or teaching time or supervisory time of PA/NP/Med Resident etc but could involve care discussion time.  I spent 30 minutes of neurocritical care time  in the care of  this patient.      Eather Popp, MD Medical Director [facility] Pager: [phone] 2023/02/07 2:25 PM  To contact Stroke Continuity provider, please refer to [email or web address]. After hours, contact General Neurology

## 2023-02-03 NOTE — Progress Notes (Signed)
 PT Cancellation Note  Patient Details Name: [name] MRN: [id number] DOB: [birth year]   Cancelled Treatment:    Reason Eval/Treat Not Completed: Medical issues which prohibited therapy - intubated, sedated, RN request hold today.   Marcine Gadway S, PT DPT Acute Rehabilitation Services Secure Chat Preferred  Office [phone]    [name] [place] 2023/01/21, 12:33 PM

## 2023-02-03 NOTE — Progress Notes (Signed)
 NAME:  Melissa Burton, MRN:  [id number], DOB:  1932-03-28, LOS: 2 ADMISSION DATE:  01/11/2023, CONSULTATION DATE: 01/11/2023 REFERRING MD: Dr. Voncile, CHIEF COMPLAINT: Left-sided weakness  History of Present Illness:  89 year old female with hypertension was brought into the emergency department after he was found down by his granddaughter, code stroke was activated as patient was noted to have left-sided weakness, left facial droop with right gaze deviation CT head was negative, CTA head and neck showed occluded right M1, patient underwent mechanical thrombectomy with TICI 2C resolved, remains intubated, PCCM was consulted for medical management.  Pertinent  Medical History   Past Medical History:  Diagnosis Date   Arthritis    Cataract    Hypertension    Osteoporosis    Thyroid disease      Significant Hospital Events: Including procedures, antibiotic start and stop dates in addition to other pertinent events   R MCA stroke 1/10 MRI w R BG petechial bleed 02/03/2023 CT H stable   Interim History / Subjective:  CT February 03, 2023 AM stable appearing relative to MRI 1/10   Objective   Blood pressure (!) 111/53, pulse (!) 115, temperature 98.6 F (37 C), temperature source Axillary, resp. rate (!) 23, height 5' 5 (1.651 m), weight 53.9 kg, SpO2 97%.    Vent Mode: CPAP;PSV FiO2 (%):  [40 %] 40 % Set Rate:  [18 bmp] 18 bmp Vt Set:  [450 mL] 450 mL PEEP:  [5 cmH20] 5 cmH20 Pressure Support:  [5 cmH20] 5 cmH20 Plateau Pressure:  [9 cmH20-19 cmH20] 9 cmH20   Intake/Output Summary (Last 24 hours) at Feb 03, 2023 0929 Last data filed at 02/03/2023 0700 Gross per 24 hour  Intake 534.38 ml  Output 600 ml  Net -65.62 ml   Filed Weights   01/11/23 1200 02/03/23 0413  Weight: 57.8 kg 53.9 kg    Examination: General: Critically ill elderly F intubated  HEENT: [place]/AT, eyes anicteric.  ETT and OGT in place Neuro: Sedation recently stopped. Pupils are 2mm. R gaze preference. Does not follow commands   Chest: Mechanically ventilated, even unlabored on PSV  Heart: irreg rhythm s1s2 cap refill < 3 sec  Abdomen: soft ndnt  Skin: c/d/w Dark eschar appearing lesion of medial gluteus  proximal to labia. Sacral skin injury as well   Resolved Hospital Problem list     Assessment & Plan:   GOC DNR discussion -D/w primary team and pt granddaughter -plan is for 1 way extubation, after family arrives and visits. I do think her resp status will likely fare ok -- biggest variable is airway protection which we talked about  - and if declines will not reintubate & would transition to comfort care. Spoke additionally about code status -- updating to DNR/I    Endotracheally intubated post procedure Difficult airway P -cont MV support until family arrives. Doing very well w PSV from mechanics standpoint.  -Plan for 1 way extubation   Acute R MCA CVA s/p mechanical thrombectomy  -involving R BG, R parietal love, insular cortex, with petechial hemorrhage in R BG.  P -SBP < 160  -secondary prevention per stroke team  -PT/OT/SLP  Afib,  rate controlled HFpEF Mitral Mitral regurg Moderate tricuspid regurg HTN HLD  -midly elevated PASP, ? pHTN  P -no AC in light of petechial hemorrhage. ASA  -optimize lytes  -cardiac monitoring  -statin -BP goal as above. Looks like outpt she has declined antiHTN med optimization   Prediabetes  -as appropriate, lifestyle mod counseling  Hypothyroidism -restart synthroid  2023/02/02   Pressure injury POA -WOC consult appreciated   Inadequate PO intake -EN via coretrak   Best Practice (right click and Reselect all SmartList Selections daily)   Diet/type: NPO EN via cortrak  DVT prophylaxis LMWH Pressure ulcer(s): present on admission see media from 1/9  GI prophylaxis: PPI Lines: N/A Foley:  Yes, and it is still needed Code Status:  DNR Last date of multidisciplinary goals of care discussion 02-02-23 d/w granddaughter  Labs   CBC: Recent Labs   Lab 01/11/23 1248 01/11/23 1249 01/11/23 1621 01/12/23 0059 01/12/23 0530 Feb 02, 2023 0803  WBC 14.4*  --   --   --  10.7* 7.9  NEUTROABS 12.6*  --   --   --  9.0*  --   HGB 14.2 14.6 11.2* 10.2* 10.9* 10.0*  HCT 42.3 43.0 33.0* 30.0* 32.5* 29.9*  MCV 93.2  --   --   --  92.6 94.0  PLT 279  --   --   --  216 187    Basic Metabolic Panel: Recent Labs  Lab 01/11/23 1248 01/11/23 1249 01/11/23 1621 01/12/23 0059 01/12/23 0530 01/12/23 1329 01/12/23 1636  NA 133* 135 138 137 136  --   --   K 4.2 4.3 2.6* 3.4* 3.5  --   --   CL 100 100  --   --  105  --   --   CO2 19*  --   --   --  21*  --   --   GLUCOSE 138* 144*  --   --  102*  --   --   BUN 16 18  --   --  16  --   --   CREATININE 0.69 0.50  --   --  0.75  --   --   CALCIUM  9.2  --   --   --  8.0*  --   --   MG  --   --   --   --   --  2.0 1.9  PHOS  --   --   --   --   --  3.2 2.7   GFR: Estimated Creatinine Clearance: 41.4 mL/min (by C-G formula based on SCr of 0.75 mg/dL). Recent Labs  Lab 01/11/23 1248 01/12/23 0530 02/02/23 0803  WBC 14.4* 10.7* 7.9    Liver Function Tests: Recent Labs  Lab 01/11/23 1248  AST 57*  ALT 28  ALKPHOS 120  BILITOT 1.1  PROT 7.0  ALBUMIN  3.9   No results for input(s): LIPASE, AMYLASE in the last 168 hours. No results for input(s): AMMONIA in the last 168 hours.  ABG    Component Value Date/Time   PHART 7.440 01/12/2023 0059   PCO2ART 30.7 (L) 01/12/2023 0059   PO2ART 160 (H) 01/12/2023 0059   HCO3 21.2 01/12/2023 0059   TCO2 22 01/12/2023 0059   ACIDBASEDEF 3.0 (H) 01/12/2023 0059   O2SAT 100 01/12/2023 0059     Coagulation Profile: Recent Labs  Lab 01/11/23 1248  INR 1.1    Cardiac Enzymes: No results for input(s): CKTOTAL, CKMB, CKMBINDEX, TROPONINI in the last 168 hours.  HbA1C: Hgb A1c MFr Bld  Date/Time Value Ref Range Status  01/11/2023 12:48 PM 6.1 (H) 4.8 - 5.6 % Final    Comment:    (NOTE) Pre diabetes:           5.7%-6.4%  Diabetes:              >  6.4%  Glycemic control for   <7.0% adults with diabetes     CBG: Recent Labs  Lab 01/12/23 1527 01/12/23 1957 01/12/23 2330 Feb 06, 2023 0336 2023/02/06 0752  GLUCAP 105* 142* 154* 113* 139*     CRITICAL CARE Performed by: Ronnald FORBES Gave   Total critical care time: 39 minutes  Critical care time was exclusive of separately billable procedures and treating other patients. Critical care was necessary to treat or prevent imminent or life-threatening deterioration.  Critical care was time spent personally by me on the following activities: development of treatment plan with patient and/or surrogate as well as nursing, discussions with consultants, evaluation of patient's response to treatment, examination of patient, obtaining history from patient or surrogate, ordering and performing treatments and interventions, ordering and review of laboratory studies, ordering and review of radiographic studies, pulse oximetry and re-evaluation of patient's condition.  Ronnald Gave MSN, AGACNP-BC [facility] Amion for pager  2023-02-06, 9:29 AM

## 2023-02-03 NOTE — Plan of Care (Signed)
 Melissa Burton evaluated post extubation with family Young POA) and RN at the bedside. Increased respiratory effort with aspiration. Neurological status unchanged. Decision made to transition to comfort care. CCM aware.   Jorene Last FNP-BC [facility]

## 2023-02-03 NOTE — Death Summary Note (Signed)
 DEATH SUMMARY   Patient Details  Name: Melissa Burton MRN: [id number] DOB: [birth year]  Admission/Discharge Information   Admit Date:  01-17-2023  Date of Death: Date of Death: 01-19-2023  Time of Death: Time of Death: December 14, 2105  Length of Stay: 2  Referring Physician: Jacques Camie Pepper, PA-C   Reason(s) for Hospitalization  Stroke  Diagnoses  Preliminary cause of death: large right middle cerebral artery stroke Secondary Diagnoses (including complications and co-morbidities):  Principal Problem:   Acute ischemic right MCA stroke [facility]) Active Problems:   Middle cerebral artery embolism, right   Paroxysmal atrial fibrillation (HCC)   Diastolic heart failure (HCC)   Hypertension   Protein-calorie malnutrition, severe   DNR (do not resuscitate)   Goals of care, counseling/discussion   Brief Hospital Course (including significant findings, care, treatment, and services provided and events leading to death)  Melissa Burton is a 89 y.o. year old female who hypertension, thyroid disease, arthritis, osteoporosis, cataracts who was found around noon this morning by her granddaughter down on the floor, minimally responsive. CAT scan which was negative for bleed CT angio and perfusion that showed a right M1 occlusion.  Patient had thrombectomy and returned to ICU intubated.  Her neurological exam did not improve and she had persistent left hemiplegia and right gaze deviation.  MRI scan confirmed a large right MCA infarct with cytotoxic edema and petechial hemorrhagic transformation.  Doing obvious that patient would have significant disability from the stroke and would require a feeding tube likely 24-hour care nursing home care.  Patient has been quite clear that she would not want this and his family member made decision for DNR/DNI with one way extubation on 01/19/2023. Patient with significant aspiration post extubation. Decision to transition to comfort care on 2023/01/19.     Pertinent Labs  and Studies  Significant Diagnostic Studies CT HEAD WO CONTRAST ( ) Result Date: 01/19/23 CLINICAL DATA:  89 year old female code stroke presentation on 01/17/23 with right M1 occlusion. Status post endovascular reperfusion. EXAM: CT HEAD WITHOUT CONTRAST TECHNIQUE: Contiguous axial images were obtained from the base of the skull through the vertex without intravenous contrast. RADIATION DOSE REDUCTION: This exam was performed according to the departmental dose-optimization program which includes automated exposure control, adjustment of the mA and/or kV according to patient size and/or use of iterative reconstruction technique. COMPARISON:  Brain MRI 01/12/2023, presentation head CT 01-17-23. FINDINGS: Brain: Patchy and confluent cytotoxic edema corresponding to the areas of restricted diffusion on MRI yesterday in the right MCA territory. Right lentiform petechial hemorrhage appears stable (series 7, image 18) Heidelberg classification 1b: HI2, confluent petechiae, no mass effect. No malignant hemorrhagic transformation. Stable effaced right lateral ventricle. No midline shift. Stable gray-white differentiation elsewhere include chronic right PICA cerebellar infarct, Patchy and confluent bilateral cerebral white matter disease. No new areas of cerebral edema identified. Vascular: Calcified atherosclerosis at the skull base. Regressed hyperdensity of the right MCA since presentation. Skull: Intact.  No acute osseous abnormality identified. Sinuses/Orbits: Visualized paranasal sinuses and mastoids are stable and well aerated. Other: Posterior convexity broad-based scalp hematoma series 4, image 21. Similar right superior convexity scalp edema or hematoma image 17. Orbits soft tissues appear stable, negative. Right nasoenteric tube now in place. IMPRESSION: 1. Stable Right MCA infarct with basal ganglia petechial hemorrhage. No malignant hemorrhagic transformation or progressive mass effect. 2. No new  intracranial abnormality. Underlying chronic ischemic disease. 3. Scalp hematomas.  Right nasoenteric tube. Electronically Signed   By: VEAR Shona HERO.D.  On: 01/14/2023 04:12   VAS US  LOWER EXTREMITY VENOUS (DVT) Result Date: 01/12/2023  Lower Venous DVT Study Patient Name:  HERMIE REAGOR  Date of Exam:   01/12/2023 Medical Rec #: [id number]         Accession #:    [id number] Date of Birth: [birth year]         Patient Gender: F Patient Age:   89 years Exam Location:  [facility] Procedure:      VAS US  LOWER EXTREMITY VENOUS (DVT) Referring Phys: ARY XU --------------------------------------------------------------------------------  Indications: Stroke.  Limitations: Bandages and patient unable to cooperate due to condition. Comparison Study: No previous Performing Technologist: Jody Hill RVT, RDMS  Examination Guidelines: A complete evaluation includes B-mode imaging, spectral Doppler, color Doppler, and power Doppler as needed of all accessible portions of each vessel. Bilateral testing is considered an integral part of a complete examination. Limited examinations for reoccurring indications may be performed as noted. The reflux portion of the exam is performed with the patient in reverse Trendelenburg.  +---------+---------------+---------+-----------+----------+--------------+ RIGHT    CompressibilityPhasicitySpontaneityPropertiesThrombus Aging +---------+---------------+---------+-----------+----------+--------------+ CFV                                                   Not visualized +---------+---------------+---------+-----------+----------+--------------+ SFJ                                                   Not visualized +---------+---------------+---------+-----------+----------+--------------+ FV Prox  Full           Yes      Yes                                 +---------+---------------+---------+-----------+----------+--------------+ FV Mid   Full            Yes      Yes                                 +---------+---------------+---------+-----------+----------+--------------+ FV DistalFull           Yes      Yes                                 +---------+---------------+---------+-----------+----------+--------------+ PFV      Full                                                        +---------+---------------+---------+-----------+----------+--------------+ POP      Full           Yes      Yes                                 +---------+---------------+---------+-----------+----------+--------------+ PTV      Full                                                        +---------+---------------+---------+-----------+----------+--------------+  PERO     Full                                                        +---------+---------------+---------+-----------+----------+--------------+ Unable to visualized CFV & SFJ due to procedure bandage.  +---------+---------------+---------+-----------+----------+--------------+ LEFT     CompressibilityPhasicitySpontaneityPropertiesThrombus Aging +---------+---------------+---------+-----------+----------+--------------+ CFV      Full           Yes      Yes                                 +---------+---------------+---------+-----------+----------+--------------+ SFJ      Full                                                        +---------+---------------+---------+-----------+----------+--------------+ FV Prox  Full           Yes      Yes                                 +---------+---------------+---------+-----------+----------+--------------+ FV Mid   Full           Yes      Yes                                 +---------+---------------+---------+-----------+----------+--------------+ FV DistalFull           Yes      Yes                                 +---------+---------------+---------+-----------+----------+--------------+ PFV      Full                                                         +---------+---------------+---------+-----------+----------+--------------+ POP      Full           Yes      Yes                                 +---------+---------------+---------+-----------+----------+--------------+ PTV      Full                                                        +---------+---------------+---------+-----------+----------+--------------+ PERO     Full                                                        +---------+---------------+---------+-----------+----------+--------------+  Summary: BILATERAL: -No evidence of popliteal cyst, bilaterally. RIGHT: - There is no evidence of deep vein thrombosis in the lower extremity. However, portions of this examination were limited- see technologist comments above.  LEFT: - There is no evidence of deep vein thrombosis in the lower extremity.  *See table(s) above for measurements and observations. Electronically signed by Penne Colorado MD on 01/12/2023 at 7:20:35 PM.    Final    DG Abd Portable 1V Result Date: 01/12/2023 CLINICAL DATA:  Feeding tube placement. EXAM: PORTABLE ABDOMEN - 1 VIEW COMPARISON:  January 11, 2023. FINDINGS: Distal tip of feeding tube is seen in expected position of distal stomach. IMPRESSION: Distal tip of feeding tube seen in expected position of distal stomach. Electronically Signed   By: Lynwood Landy Raddle M.D.   On: 01/12/2023 14:11   MR BRAIN WO CONTRAST Result Date: 01/12/2023 CLINICAL DATA:  Stroke, follow up EXAM: MRI HEAD WITHOUT CONTRAST MRA HEAD WITHOUT CONTRAST TECHNIQUE: Multiplanar, multi-echo pulse sequences of the brain and surrounding structures were acquired without intravenous contrast. Angiographic images of the Circle of Willis were acquired using MRA technique without intravenous contrast. COMPARISON:  CT Head / CTA head neck 01/11/23 FINDINGS: MRI HEAD FINDINGS Brain: Acute right MCA territory infarct involving the right basal  ganglia, right frontal lobe, right parietal lobe, the insular cortex, in the anterior right temporal lobe. There is petechial hemorrhage in the right basal ganglia. No mass effect. No mass lesion. No hydrocephalus. No extra-axial fluid collection. There is a chronic right cerebellar infarct. There is a background of moderate chronic microvascular ischemic change. Vascular: See below Skull and upper cervical spine: Normal marrow signal. Sinuses/Orbits: No middle ear or mastoid effusion. Paranasal sinuses are clear. Bilateral lens replacement. Orbits are otherwise unremarkable. Other: None. MRA HEAD FINDINGS Anterior circulation: There is interval restoration of flow through the distal M1 segment of the right MCA. There is now opacification of the right M2 and M3 branches with likely residual areas of stenosis in the right M3 segment. Bilateral ACA and left MCA territories are normal in appearance. Posterior circulation: No aneurysm. No occlusion. No vascular malformation. Anatomic variants: None IMPRESSION: 1. Acute large right MCA territory infarct with petechial hemorrhage in the right basal ganglia. 2. Interval restoration of flow through the distal M1 segment of the right MCA with residual areas of stenosis in the right M3 segments. These results will be called to the ordering clinician or representative by the Radiologist Assistant, and communication documented in the PACS or [employer]. Electronically Signed   By: Lyndall Gore M.D.   On: 01/12/2023 11:09   MR ANGIO HEAD WO CONTRAST Result Date: 01/12/2023 CLINICAL DATA:  Stroke, follow up EXAM: MRI HEAD WITHOUT CONTRAST MRA HEAD WITHOUT CONTRAST TECHNIQUE: Multiplanar, multi-echo pulse sequences of the brain and surrounding structures were acquired without intravenous contrast. Angiographic images of the Circle of Willis were acquired using MRA technique without intravenous contrast. COMPARISON:  CT Head / CTA head neck 01/11/23 FINDINGS: MRI HEAD  FINDINGS Brain: Acute right MCA territory infarct involving the right basal ganglia, right frontal lobe, right parietal lobe, the insular cortex, in the anterior right temporal lobe. There is petechial hemorrhage in the right basal ganglia. No mass effect. No mass lesion. No hydrocephalus. No extra-axial fluid collection. There is a chronic right cerebellar infarct. There is a background of moderate chronic microvascular ischemic change. Vascular: See below Skull and upper cervical spine: Normal marrow signal. Sinuses/Orbits: No middle ear or mastoid effusion. Paranasal sinuses  are clear. Bilateral lens replacement. Orbits are otherwise unremarkable. Other: None. MRA HEAD FINDINGS Anterior circulation: There is interval restoration of flow through the distal M1 segment of the right MCA. There is now opacification of the right M2 and M3 branches with likely residual areas of stenosis in the right M3 segment. Bilateral ACA and left MCA territories are normal in appearance. Posterior circulation: No aneurysm. No occlusion. No vascular malformation. Anatomic variants: None IMPRESSION: 1. Acute large right MCA territory infarct with petechial hemorrhage in the right basal ganglia. 2. Interval restoration of flow through the distal M1 segment of the right MCA with residual areas of stenosis in the right M3 segments. These results will be called to the ordering clinician or representative by the Radiologist Assistant, and communication documented in the PACS or [employer]. Electronically Signed   By: Lyndall Gore M.D.   On: 01/12/2023 11:09   ECHOCARDIOGRAM COMPLETE Result Date: 01/12/2023    ECHOCARDIOGRAM REPORT   Patient Name:   AVIGAYIL TON Date of Exam: 01/12/2023 Medical Rec #:  [id number]        Height:       65.0 in Accession #:    [id number]       Weight:       127.4 lb Date of Birth:  [birth year]        BSA:          1.633 m Patient Age:    89 years         BP:           123/70 mmHg Patient Gender: F                 HR:           95 bpm. Exam Location:  Inpatient Procedure: 2D Echo, Cardiac Doppler and Color Doppler Indications:    Stroke  History:        Patient has no prior history of Echocardiogram examinations.  Sonographer:    Juanita Shaw Referring Phys: [id number] ASHISH ARORA IMPRESSIONS  1. Hyperdynamic LV. Mild intracavitary gradient of . Left ventricular ejection fraction, by estimation, is 70 to 75%. The left ventricle has hyperdynamic function. The left ventricle has no regional wall motion abnormalities. There is mild concentric left ventricular hypertrophy. Left ventricular diastolic parameters are consistent with Grade I diastolic dysfunction (impaired relaxation).  2. Right ventricular systolic function is mildly reduced. The right ventricular size is normal. There is mildly elevated pulmonary artery systolic pressure.  3. Left atrial size was moderately dilated.  4. The mitral valve is normal in structure. Trivial mitral valve regurgitation. No evidence of mitral stenosis.  5. Tricuspid valve regurgitation is moderate.  6. The aortic valve is normal in structure. Aortic valve regurgitation is not visualized. No aortic stenosis is present. FINDINGS  Left Ventricle: Hyperdynamic LV. Mild intracavitary gradient of . Left ventricular ejection fraction, by estimation, is 70 to 75%. The left ventricle has hyperdynamic function. The left ventricle has no regional wall motion abnormalities. The left ventricular internal cavity size was small. There is mild concentric left ventricular hypertrophy. Left ventricular diastolic parameters are consistent with Grade I diastolic dysfunction (impaired relaxation). Right Ventricle: The right ventricular size is normal. No increase in right ventricular wall thickness. Right ventricular systolic function is mildly reduced. There is mildly elevated pulmonary artery systolic pressure. The tricuspid regurgitant velocity  is 2.71 m/s, and with an assumed right  atrial pressure of 8 mmHg, the estimated right ventricular systolic pressure  is 37.4 mmHg. Left Atrium: Left atrial size was moderately dilated. Right Atrium: Right atrial size was normal in size. Pericardium: There is no evidence of pericardial effusion. Mitral Valve: The mitral valve is normal in structure. There is mild thickening of the mitral valve leaflet(s). Trivial mitral valve regurgitation. No evidence of mitral valve stenosis. MV peak gradient, 4.0 mmHg. The mean mitral valve gradient is 1.0 mmHg. Tricuspid Valve: The tricuspid valve is normal in structure. Tricuspid valve regurgitation is moderate . No evidence of tricuspid stenosis. Aortic Valve: The aortic valve is normal in structure. Aortic valve regurgitation is not visualized. No aortic stenosis is present. Aortic valve mean gradient measures 3.0 mmHg. Aortic valve peak gradient measures 4.7 mmHg. Aortic valve area, by VTI measures 1.67 cm. Pulmonic Valve: The pulmonic valve was normal in structure. Pulmonic valve regurgitation is trivial. No evidence of pulmonic stenosis. Aorta: The aortic root is normal in size and structure. Venous: IVC assessment for right atrial pressure unable to be performed due to mechanical ventilation. IAS/Shunts: No atrial level shunt detected by color flow Doppler.  LEFT VENTRICLE PLAX 2D LVIDd:         3.50 cm     Diastology LVIDs:         2.30 cm     LV e' medial:    7.18 cm/s LV PW:         0.90 cm     LV E/e' medial:  10.1 LV IVS:        0.60 cm     LV e' lateral:   6.73 cm/s LVOT diam:     1.60 cm     LV E/e' lateral: 10.8 LV SV:         30 LV SV Index:   18 LVOT Area:     2.01 cm  LV Volumes (MOD) LV vol d, MOD A2C: 42.4 ml LV vol d, MOD A4C: 37.2 ml LV vol s, MOD A2C: 18.4 ml LV vol s, MOD A4C: 15.7 ml LV SV MOD A2C:     24.0 ml LV SV MOD A4C:     37.2 ml LV SV MOD BP:      26.3 ml RIGHT VENTRICLE            IVC RV Basal diam:  3.10 cm    IVC diam: 1.70 cm RV Mid diam:    2.60 cm RV S prime:     7.62 cm/s TAPSE  (M-mode): 1.4 cm LEFT ATRIUM             Index        RIGHT ATRIUM          Index LA diam:        2.80 cm 1.71 cm/m   RA Area:     8.87 cm LA Vol (A2C):   19.2 ml 11.76 ml/m  RA Volume:   18.80 ml 11.51 ml/m LA Vol (A4C):   55.6 ml 34.04 ml/m LA Biplane Vol: 32.3 ml 19.78 ml/m  AORTIC VALVE                    PULMONIC VALVE AV Area (Vmax):    1.66 cm     PV Vmax:          0.95 m/s AV Area (Vmean):   1.47 cm     PV Peak grad:     3.6 mmHg AV Area (VTI):     1.67 cm     PR End  Diast Vel: 4.16 msec AV Vmax:           108.15 cm/s AV Vmean:          77.900 cm/s AV VTI:            0.178 m AV Peak Grad:      4.7 mmHg AV Mean Grad:      3.0 mmHg LVOT Vmax:         89.40 cm/s LVOT Vmean:        56.800 cm/s LVOT VTI:          0.148 m LVOT/AV VTI ratio: 0.83  AORTA Ao Root diam: 2.70 cm Ao Asc diam:  2.40 cm MITRAL VALVE               TRICUSPID VALVE MV Area (PHT): 5.66 cm    TR Peak grad:   29.4 mmHg MV Area VTI:   1.80 cm    TR Vmax:        271.00 cm/s MV Peak grad:  4.0 mmHg MV Mean grad:  1.0 mmHg    SHUNTS MV Vmax:       1.00 m/s    Systemic VTI:  0.15 m MV Vmean:      49.2 cm/s   Systemic Diam: 1.60 cm MV Decel Time: 134 msec MV E velocity: 72.80 cm/s Morene Brownie Electronically signed by Morene Brownie Signature Date/Time: 01/12/2023/8:25:37 AM    Final    DG Abd Portable 1V Result Date: 01/11/2023 CLINICAL DATA:  Check gastric catheter placement EXAM: PORTABLE ABDOMEN - 1 VIEW COMPARISON:  None Available. FINDINGS: Gastric catheter is noted within the stomach. The large and small bowel gas is noted. Contrast from recent contrast procedure is noted. Scattered fecal material is noted within the colon consistent with mild constipation. IMPRESSION: Gastric catheter within the stomach. Mild constipation. Electronically Signed   By: [name] Devonshire M.D.   On: 01/11/2023 17:54   DG Chest Port 1 View Result Date: 01/11/2023 CLINICAL DATA:  Stroke and status post intubation. EXAM: PORTABLE CHEST 1 VIEW  COMPARISON:  None Available. FINDINGS: The heart size and mediastinal contours are within normal limits. Endotracheal tube present with the tip approximately 3 cm above the carina. There is no evidence of pulmonary edema, consolidation, pneumothorax or pleural fluid. The visualized skeletal structures are unremarkable. IMPRESSION: Endotracheal tube tip approximately 3 cm above the carina. No acute findings. Electronically Signed   By: Marcey Moan M.D.   On: 01/11/2023 16:52   CT ANGIO HEAD NECK W WO CM W PERF (CODE STROKE) Result Date: 01/11/2023 CLINICAL DATA:  Neuro deficit, acute, stroke suspected. Right-sided gaze. Left-sided droop and weakness. Abnormal CT. EXAM: CT ANGIOGRAPHY HEAD AND NECK CT PERFUSION BRAIN TECHNIQUE: Multidetector CT imaging of the head and neck was performed using the standard protocol during bolus administration of intravenous contrast. Multiplanar CT image reconstructions and MIPs were obtained to evaluate the vascular anatomy. Carotid stenosis measurements (when applicable) are obtained utilizing NASCET criteria, using the distal internal carotid diameter as the denominator. Multiphase CT imaging of the brain was performed following IV bolus contrast injection. Subsequent parametric perfusion maps were calculated using RAPID software. RADIATION DOSE REDUCTION: This exam was performed according to the departmental dose-optimization program which includes automated exposure control, adjustment of the mA and/or kV according to patient size and/or use of iterative reconstruction technique. CONTRAST:  OMNIPAQUE  IOHEXOL  350 MG/ML SOLN COMPARISON:  CT head without contrast 01/11/2023 FINDINGS: Aortic arch: Atherosclerotic calcifications are present at  the aortic arch. Calcifications are present the origin left common carotid artery and the subclavian artery without focal stenosis. Left vertebral artery originates directly from the arch. No aneurysm or stenosis is present. No  dissection is present. Right carotid system: The right common carotid artery is within normal limits. Atherosclerotic changes are present bifurcation with calcified and noncalcified plaque. No focal stenosis is present. The cervical right ICA is within normal limits. Left carotid system: Minimal mural calcifications are present in the left common carotid artery without significant stenosis. Minimal calcifications are present at the proximal left ICA without significant stenosis. The cervical left ICA is otherwise normal. Vertebral arteries: Right vertebral artery is dominant. It originates from the is clear right subclavian artery without significant stenosis. The left vertebral artery is hypoplastic, originating from the aortic arch without focal stenosis. No significant stenosis is present in either vertebral artery in the neck. Skeleton: Multilevel degenerative changes are present in the cervical spine. Slight anterolisthesis is present at C3-4. No focal osseous lesions are present. Other neck: Soft tissues the neck are otherwise unremarkable. Salivary glands are within normal limits. Thyroid is normal. No significant adenopathy is present. No focal mucosal or submucosal lesions are present. Upper chest: Scarring is present the lung apices bilaterally. No edema or effusion is present. Review of the MIP images confirms the above findings CTA HEAD FINDINGS Anterior circulation: Internal carotid arteries scratched at minimal calcifications are present within the cavernous internal carotid arteries bilaterally. No focal stenosis is present through the ICA termini. The A1 and M1 segments are scratched at the A1 segments are normal bilaterally. Left M1 segment and bifurcation is normal. Focal occlusion is present in the right M1 segment with relatively poor collateralization to the right MCA territory. The left MCA and bilateral ACA branch vessels are within normal limits. No aneurysm is present. Posterior circulation:  PICA origins are visualized and normal bilaterally. Hypoplastic distal V4 segments are present. The basilar artery is small, terminating at the superior cerebellar arteries. The posterior cerebral arteries are of fetal type bilaterally. PCA branch vessels are within normal limits bilaterally. No aneurysm is present. Venous sinuses: The dural sinuses are patent. The straight sinus and deep cerebral veins are intact. Cortical veins are within normal limits. No significant vascular malformation is evident. Anatomic variants: Fetal type posterior cerebral arteries bilaterally. Review of the MIP images confirms the above findings CT Brain Perfusion Findings: ASPECTS: 9/10 CBF (<30%) Volume: 8mL Perfusion (Tmax>6.0s) volume: Mismatch Volume: Infarction Location:Right lentiform nucleus. IMPRESSION: 1. Focal occlusion of the right M1 segment with relatively poor collateralization to the right MCA territory. 2. 8 mL core infarct of the right lentiform nucleus with 110 mL of ischemic penumbra. 3. Atherosclerotic changes at the right carotid bifurcation and proximal left ICA without significant stenosis. 4. 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 The right vertebral artery is dominant. 5. Multilevel degenerative changes in the cervical spine. 6.  Aortic Atherosclerosis (ICD10-I70.0). Electronically Signed   By: Lonni Necessary M.D.   On: 01/11/2023 13:34   CT HEAD CODE STROKE WO CONTRAST Result Date: 01/11/2023 CLINICAL DATA:  Code stroke.  Left-sided weakness EXAM: CT HEAD WITHOUT CONTRAST TECHNIQUE: Contiguous axial images were obtained from the base of the skull through the vertex without intravenous contrast. RADIATION DOSE REDUCTION: This exam was performed according to the departmental dose-optimization program which includes automated exposure control, adjustment of the mA and/or kV according to patient size and/or use of iterative reconstruction technique.  COMPARISON:  None Available. FINDINGS: Brain: There is a background of severe chronic microvascular ischemic change with multiple likely chronic underlying infarcts. There may be an acute infarcts involving the right lentiform nucleus (series 2, image 24). No hemorrhage. No hydrocephalus. No extra-axial fluid collection. No mass effect. No mass lesion. Vascular: Hyperdense right MCA. Skull: Normal. Negative for fracture or focal lesion. Sinuses/Orbits: No middle ear or mastoid effusion. Paranasal sinuses are clear. Bilateral lens replacement. Orbits are otherwise unremarkable. Other: None. ASPECTS ([place] Stroke Program Early CT Score): 9 IMPRESSION: 1. Possible acute infarct involving the right lentiform nucleus. No hemorrhage. ASPECTS 9 2. Hyperdense right MCA. 3. Background of severe chronic microvascular ischemic change with multiple underlying chronic infarcts. Findings were paged to Dr. Arora on 01/11/2023 at 12:55 p.m. Electronically Signed   By: Lyndall Gore M.D.   On: 01/11/2023 12:57    Microbiology Recent Results (from the past 240 hours)  SARS Coronavirus 2 by RT PCR (hospital order, performed in [facility] hospital lab) *cepheid single result test* Anterior Nasal Swab     Status: None   Collection Time: 01/11/23  1:00 PM   Specimen: Anterior Nasal Swab  Result Value Ref Range Status   SARS Coronavirus 2 by RT PCR NEGATIVE NEGATIVE Final    Comment: Performed at [facility], [address], [place], [place] [address]    Lab Basic Metabolic Panel: Recent Labs  Lab 01/11/23 1248 01/11/23 1249 01/11/23 1621 01/12/23 0059 01/12/23 0530 01/12/23 1329 01/12/23 1636 2023-01-14 0803  NA 133* 135 138 137 136  --   --  135  K 4.2 4.3 2.6* 3.4* 3.5  --   --  4.1  CL 100 100  --   --  105  --   --  107  CO2 19*  --   --   --  21*  --   --  23  GLUCOSE 138* 144*  --   --  102*  --   --  139*  BUN 16 18  --   --  16  --   --  34*  CREATININE 0.69 0.50  --   --  0.75  --   --  0.61   CALCIUM  9.2  --   --   --  8.0*  --   --  8.1*  MG  --   --   --   --   --  2.0 1.9 2.1  PHOS  --   --   --   --   --  3.2 2.7 1.7*   Liver Function Tests: Recent Labs  Lab 01/11/23 1248  AST 57*  ALT 28  ALKPHOS 120  BILITOT 1.1  PROT 7.0  ALBUMIN  3.9   No results for input(s): LIPASE, AMYLASE in the last 168 hours. No results for input(s): AMMONIA in the last 168 hours. CBC: Recent Labs  Lab 01/11/23 1248 01/11/23 1249 01/11/23 1621 01/12/23 0059 01/12/23 0530 01-14-2023 0803  WBC 14.4*  --   --   --  10.7* 7.9  NEUTROABS 12.6*  --   --   --  9.0*  --   HGB 14.2 14.6 11.2* 10.2* 10.9* 10.0*  HCT 42.3 43.0 33.0* 30.0* 32.5* 29.9*  MCV 93.2  --   --   --  92.6 94.0  PLT 279  --   --   --  216 187   Cardiac Enzymes: No results for input(s): CKTOTAL, CKMB, CKMBINDEX, TROPONINI in the last  168 hours. 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 No hemorrhage. ASPECTS 9.  Hyperdense right MCA CTA head & neck Focal occlusion of the right M1 segment with relatively poor collateralization to the right MCA territory. CT perfusion 8 mL core infarct of the right lentiform nucleus with 110 mL of ischemic penumbra. 1/9 IR with TICI2c reperfusion Post IR CT- no evidence of ICH . Hyperattenuation of the RT basal ganglia.  MRI  Acute large right MCA territory infarct with petechial hemorrhage in the right basal ganglia.  MRA Interval restoration of flow through the distal M1 segment of the right MCA with residual areas of stenosis in the right M3 segments. January 17, 2023 Repeat Head CT- Stable Right MCA infarct with basal ganglia petechial hemorrhage. No malignant hemorrhagic transformation or  progressive mass effect. No new intracranial abnormality. Underlying chronic ischemic disease. Scalp hematomas.  Jan 17, 2023- Extubation and transition to comfort care 2D Echo EF 70-75%  LDL 50 HgbA1c 6.1   Devon Shafer 01/14/2023, 8:07 AM  I have personally obtained history,examined this patient, reviewed notes, independently viewed imaging studies, participated in medical decision making and plan of care.ROS completed by me personally and pertinent positives fully documented  I have made any additions or clarifications directly to the above note. Agree with note above.    Eather Popp, MD Medical Director [facility] Pager: [phone] 01/14/2023 11:07 AM

## 2023-02-03 NOTE — Plan of Care (Signed)
 D/w primary NP -- pt has transitioned to comfort care. PCCM will sign off at this time. Please let us know if we can be of further assistance   Tessie Fass MSN, AGACNP-BC [facility] Pulmonary/Critical Care Medicine Jan 29, 2023, 3:38 PM

## 2023-02-03 DEATH — deceased
